# Patient Record
Sex: Female | Born: 1960 | Hispanic: No | Marital: Married | Smoking: Never smoker
Health system: Southern US, Community
[De-identification: ages and names within clinical notes are randomized; demographics above are authoritative.]

## PROBLEM LIST (undated history)

## (undated) DIAGNOSIS — Z789 Other specified health status: Secondary | ICD-10-CM

---

## 2002-07-02 ENCOUNTER — Emergency Department (HOSPITAL_COMMUNITY): Admission: EM | Admit: 2002-07-02 | Discharge: 2002-07-02 | Payer: Self-pay | Admitting: Emergency Medicine

## 2002-07-02 ENCOUNTER — Encounter: Payer: Self-pay | Admitting: Emergency Medicine

## 2004-09-25 ENCOUNTER — Encounter: Admission: RE | Admit: 2004-09-25 | Discharge: 2004-09-25 | Payer: Self-pay | Admitting: Cardiovascular Disease

## 2004-10-23 ENCOUNTER — Encounter: Admission: RE | Admit: 2004-10-23 | Discharge: 2004-10-23 | Payer: Self-pay | Admitting: Cardiovascular Disease

## 2004-11-01 ENCOUNTER — Encounter: Admission: RE | Admit: 2004-11-01 | Discharge: 2004-11-01 | Payer: Self-pay | Admitting: Cardiovascular Disease

## 2005-12-05 ENCOUNTER — Encounter: Admission: RE | Admit: 2005-12-05 | Discharge: 2005-12-05 | Payer: Self-pay | Admitting: Cardiovascular Disease

## 2006-10-28 HISTORY — PX: ABDOMINAL HYSTERECTOMY: SHX81

## 2007-05-25 ENCOUNTER — Other Ambulatory Visit: Admission: RE | Admit: 2007-05-25 | Discharge: 2007-05-25 | Payer: Self-pay | Admitting: Family Medicine

## 2007-08-06 ENCOUNTER — Encounter (INDEPENDENT_AMBULATORY_CARE_PROVIDER_SITE_OTHER): Payer: Self-pay | Admitting: Obstetrics and Gynecology

## 2007-08-06 ENCOUNTER — Inpatient Hospital Stay (HOSPITAL_COMMUNITY): Admission: RE | Admit: 2007-08-06 | Discharge: 2007-08-08 | Payer: Self-pay | Admitting: Obstetrics and Gynecology

## 2008-10-12 IMAGING — US US PELVIS COMPLETE
1 series · 13 of 25 positions shown · non-contrast
Comparison: NONE

CLINICAL DATA: Enlarged uterus. 

PELVIC ULTRASOUND

[Series 1: us pelvic · 0.32mm/px · 13 of 51 slices shown]
[im 1/51]
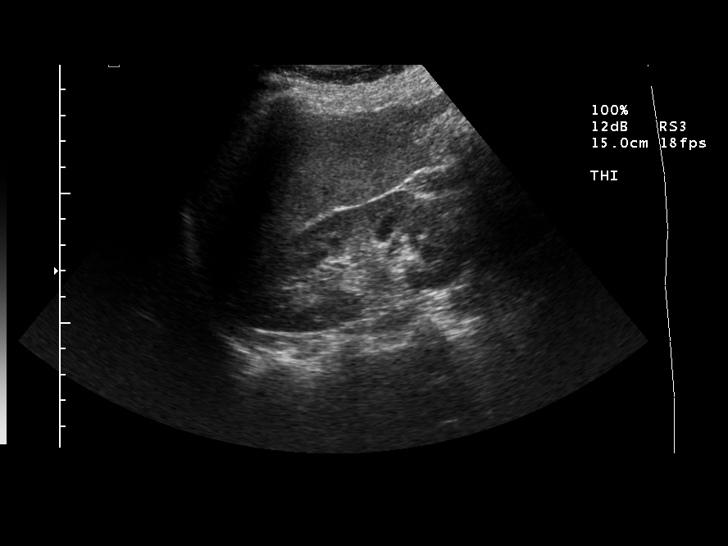
[im 5/51]
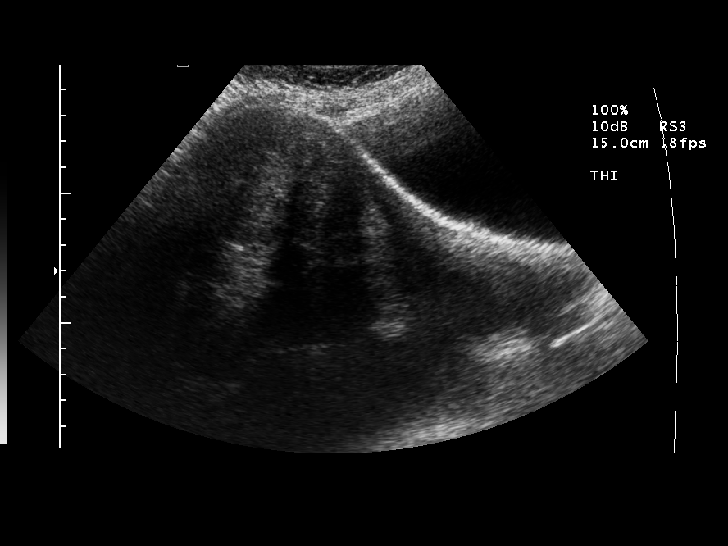
[im 9/51]
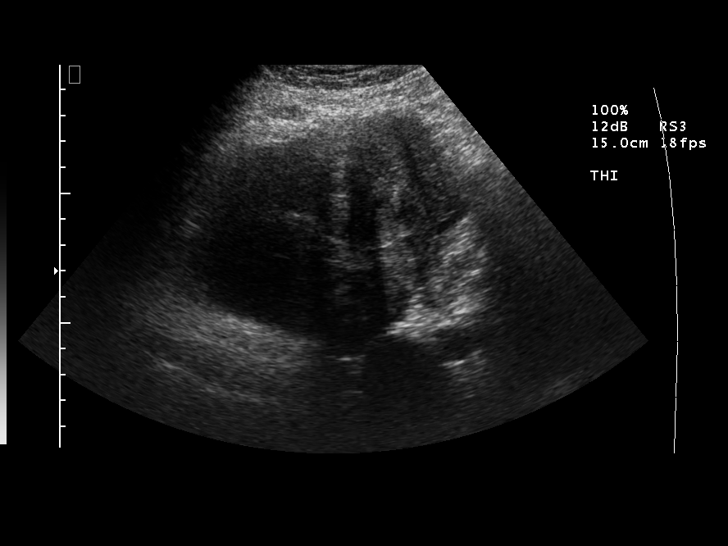
[im 13/51]
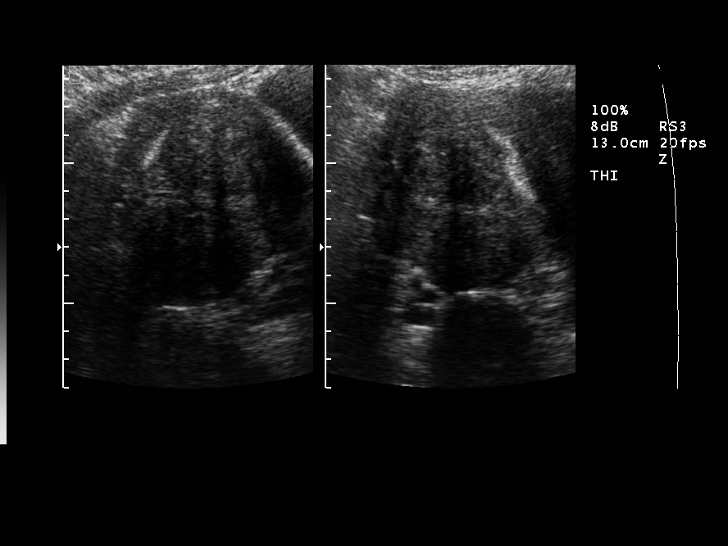
[im 17/51]
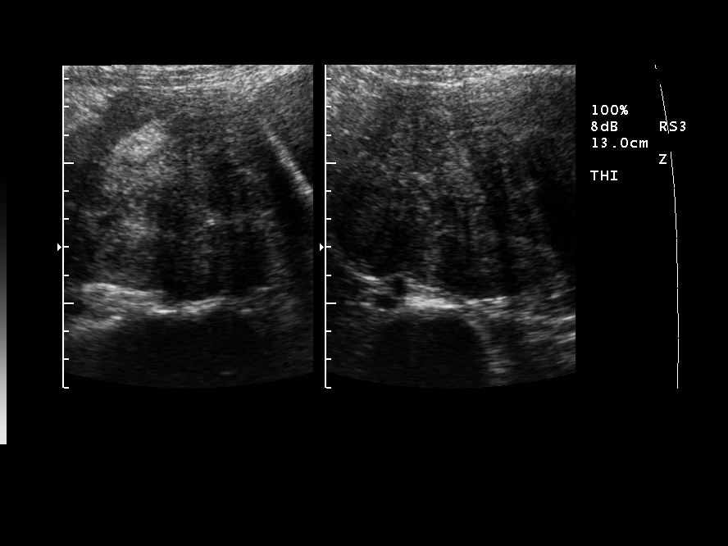
[im 21/51]
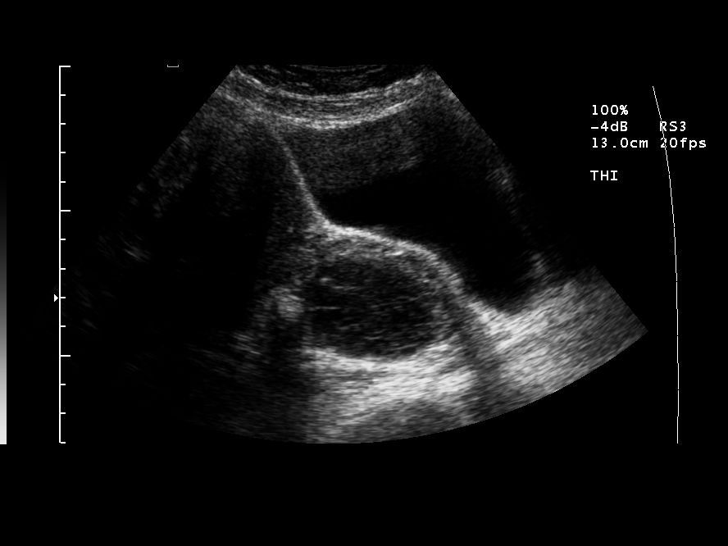
[im 26/51]
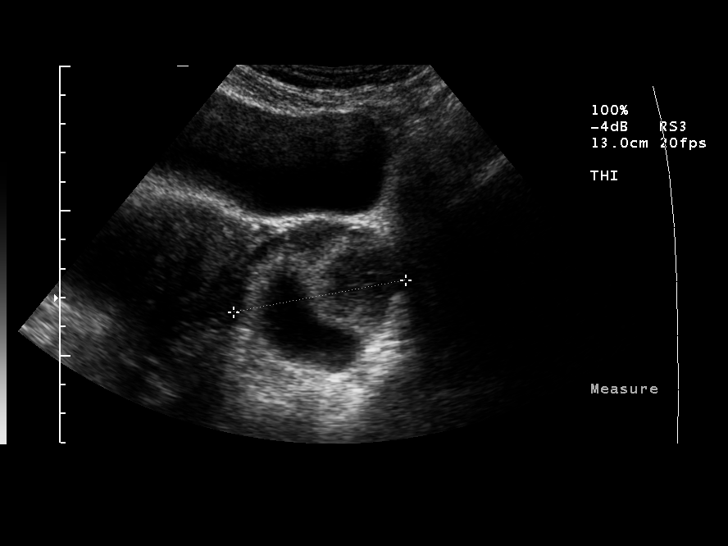
[im 30/51]
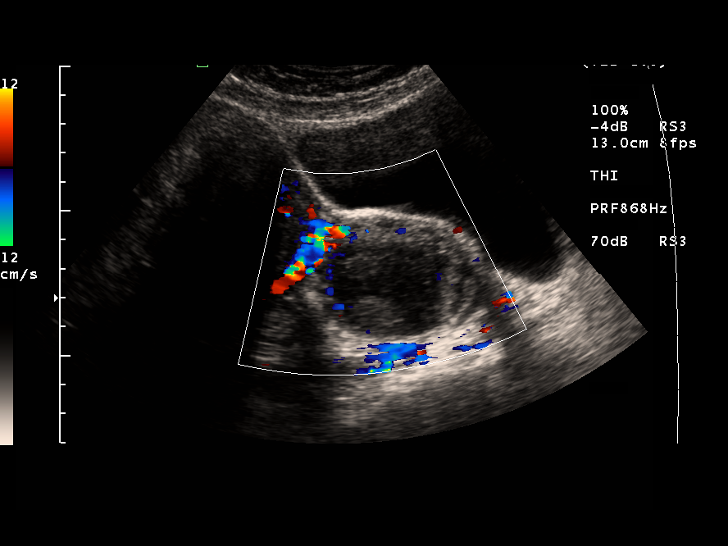
[im 34/51]
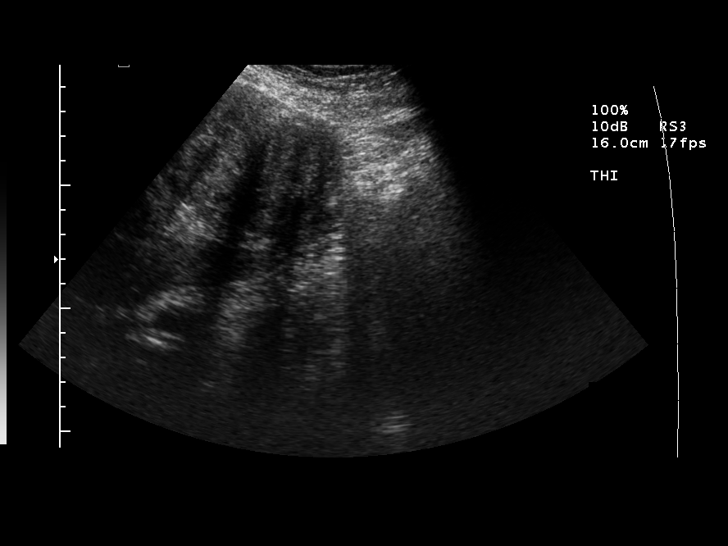
[im 38/51]
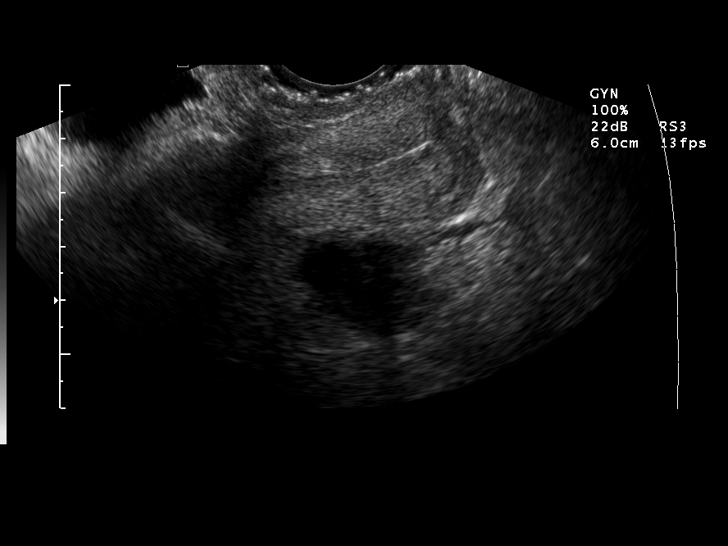
[im 42/51]
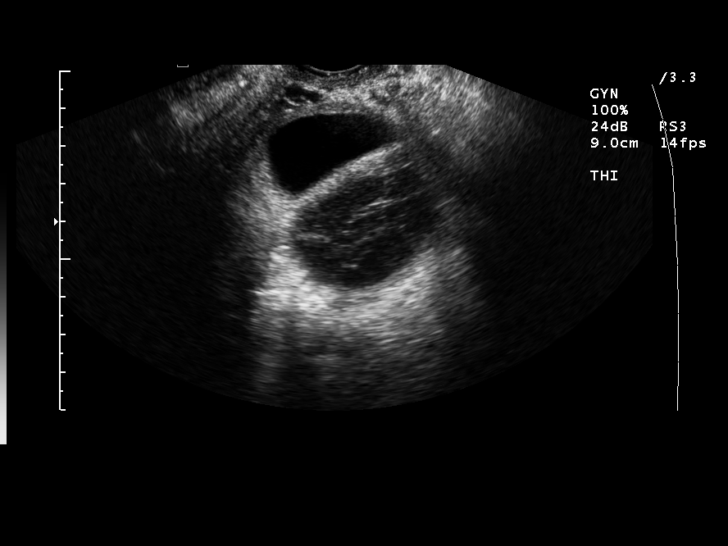
[im 46/51]
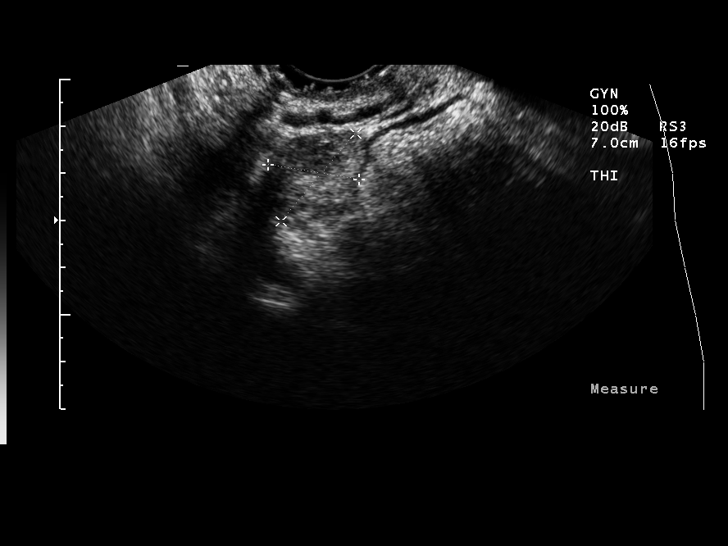
[im 51/51]
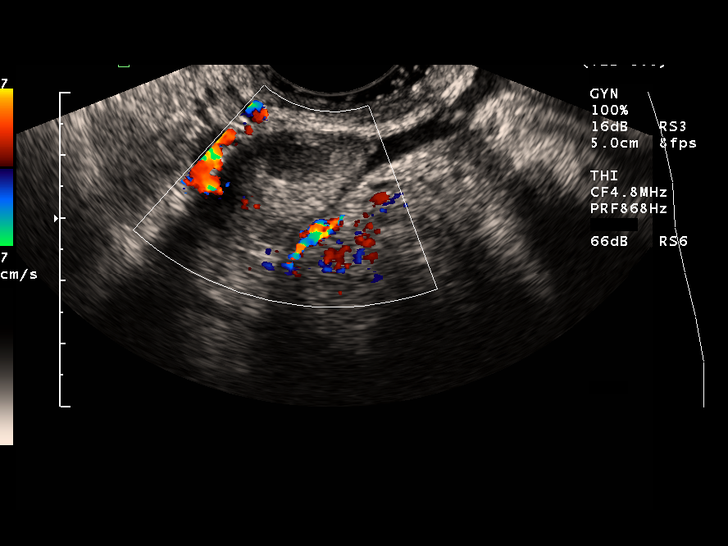

[13 of 25 positions shown; findings below may reference images not displayed]

FINDINGS: Transabdominal and endovaginal ultrasound was 
performed. The uterus measures 16 x 11.6 x 10.2 cm with an 
estimated volume of 987 mL. This is markedly enlarged. The 
endometrial stripe measures 5 mm and is within normal limits. 
There is an isoechoic myometrial mass seen which exerts a mass 
effect on the endometrial stripe. This measures 4 x 3.7 x 3.2 cm. 
There is a second myometrial mass seen. This appears to be 
slightly hypoechoic and measures 3.6 x 4.2 x 3.7 cm. There is a 
third hypoechoic myometrial mass seen that measures 4.1 x 4 x
cm. There is an isoechoic myometrial mass seen that measures 5.6 x 
5.9 x 5.4 cm. No abnormal fluid is seen in the uterine cavity. 
RIGHT OVARY: Measures 2.9 x 2 x 2.4 cm. With an estimated volume 
of 7 mL. This is within normal limits. There is a hypoechoic area 
seen in the right ovary that measures 10 x 7 x 10 mm. LEFT OVARY: 
Measures 6.2 x 6 x 4.2 cm with an estimated volume of 82 mL. This 
is markedly enlarged. There is a hypoechoic area seen that 
measures 2.7 x 5 x 3.4 cm. There is a simple-appearing cyst seen 
that measures 3 x 4.2 x 3.7 cm. Suggestion of a small amount of 
free fluid in the cul-de-sac.
IMPRESSION: Complex cyst or cystic neoplasm in the left ovary. I 
would recommend a follow-up ultrasound after 2 months or 2 
menstrual cycles to re-evaluate this finding. Left ovary is 
markedly enlarged. The uterus is markedly enlarged. There are 
multiple leiomyomata seen. Apartmentsgasparini Homen, M.D. Electronically 
06/12/2007 [facility]  [REDACTED]

## 2011-03-12 NOTE — Discharge Summary (Signed)
[name], [name]                  ACCOUNT NO.:  [id number]   MEDICAL RECORD NO.:  [id number]          PATIENT TYPE:  INP   LOCATION:  9318                          FACILITY:  [facility]   PHYSICIAN:  Charles A. Delcambre, MDDATE OF BIRTH:  11-08-1960   DATE OF ADMISSION:  08/06/2007  DATE OF DISCHARGE:  08/08/2007                               DISCHARGE SUMMARY   PRIMARY DISCHARGE DIAGNOSES:  1. Menorrhagia.  2. Uterine leiomyomata.  3. Mild endometriosis.  4. Ovarian cysts, benign.   PROCEDURE:  Transabdominal hysterectomy, bilateral salpingo-  oophorectomy.   PATHOLOGY:  Uterus, tubes and ovaries to pathology, pathology pending.   POSTOPERATIVE LABORATORY:  Hemoglobin 8.8, hematocrit 25.7, platelets  232,000, white blood cell count 7300.  Originally, as I recall, had a  SGPT of 50 and repeat on admission was 38. Fasting sugar was 100 and  upon admission sugar was 105 on CBG as I recall.  Uterus, tubes and  ovaries were sent to pathology.   History and physical as dictated on the chart.   HOSPITAL COURSE:  The patient was admitted and underwent surgery as  noted above.  Postoperatively she had a routine postoperative course. On  postoperative day one  PCA was discontinued. She was changed to Percocet and did well.  She  voided without difficulty when the catheter was discontinued.  Showered  and dressing was removed that evening.  She had spontaneous return of  flatus on postoperative day one.  On postoperative day two she continues  to do well, has no nausea or vomiting.  Her pain is controlled.  Diet as  tolerated.  She is voiding without difficulty.  Exam is benign and  therefore she is discharged home.   FOLLOW UP:  In the office on August 11, 2007 for staple removal (that  would be at 72 hours).   MEDICATIONS:  A prescription for Percocet 5/325 #40 1-2 p.o. q.4 hours  p.r.n., Motrin 800 mg one p.o. q.8h p.r.n. #30 refill x2., and Tandem  once daily #30 refill x1  (she  may use an iron supplement that she already has instead of Tandem  if so would like).   She is instructed to call for temperature greater than 100 degrees,  increased pain or significant vaginal bleeding, incisional erythema or  drainage. Instructions for no driving x2 weeks.  Shower okay x2 weeks,  bath thereafter.  No lifting greater than 20-30 pounds x4 weeks and no  vaginal penetration for sexual activity x4-6 weeks until she has a  postoperative check for complete healing evaluation.      Charles A. Sydnee Cabal, MD  Electronically Signed     CAD/MEDQ  D:  08/08/2007  T:  08/08/2007  Job:  [id number]

## 2011-03-12 NOTE — Op Note (Signed)
NAMEJAMISEN, Theresa Stevenson                  ACCOUNT NO.:  [id number]   MEDICAL RECORD NO.:  [id number]          PATIENT TYPE:  AMB   LOCATION:  SDC                           [facility]   PHYSICIAN:  Charles A. Delcambre, MDDATE OF BIRTH:  03-06-1961   DATE OF PROCEDURE:  08/06/2007  DATE OF DISCHARGE:                               OPERATIVE REPORT   PREOPERATIVE DIAGNOSES:  1. Menorrhagia.  2. Uterine leiomyomata.  3. Ovarian cysts, one appearing complex, on the left.   POSTOPERATIVE DIAGNOSES:  1. Menorrhagia.  2. Uterine leiomyomata.  3. Ovarian cysts, appeared simple.  4. Mild endometriosis was noted.   PROCEDURE:  Transabdominal hysterectomy and bilateral salpingo-  oophorectomy.   SURGEON:  Charles A. Sydnee Cabal, M.D.   ASSISTANT:  Gerald Leitz, M.D.   COMPLICATIONS:  None.   ESTIMATED BLOOD LOSS:  200 mL.   ANESTHESIA:  General by the endotracheal route.   FINDINGS:  Multiple uterine leiomyomata, bilateral simple-appearing  ovarian cysts, several small, less than 5-mm superficial endometriosis  implants in the posterior cul-de-sac and one on the sigmoid colon and  several on the anterior uterus and peritoneum.   SPECIMEN:  Uterus, tubes and ovaries to pathology.   COMPLICATIONS:  None.   COUNTS:  Instrument, sponge, and needle counts correct x2.   DESCRIPTION OF PROCEDURE:  The patient was taken to the operating room  and placed in the supine position.  General anesthetic was induced  without difficulty.  She was sterilely prepped and draped in the usual  fashion.   A vertical skin incision was made up to the umbilicus and carried about  half-way around the umbilicus to make room for the uterus which was  approximately 18 weeks size and with the possibility of an ovarian mass.  The fascia was incised with a knife and Mayo scissors.  The rectus  muscles were bluntly dissected in the midline.  The peritoneum was  entered with Metzenbaum scissors without damage to  bowel, bladder, or  vascular structures.  The peritoneum was taken superiorly and  inferiorly, and the bladder was skeletonized.  Traction was used.  A  Balfour retractor was placed.  Moistened laps x5 were used to pack the  bowel out of the pelvis.  Kelly clamps were placed on the uterine  cornual regions.  The round ligaments were transected bilaterally with  the Bovie and transfixion stitched with 0 Vicryl and held.  The  vesicouterine peritoneum was taken down across the lower uterine segment  in preparation for development of the bladder flap.  The  infundibulopelvic pedicle was isolated.  The ureter was seen well clear  of the pedicle.  The pedicle was taken, free tied, and transfixion  stitched with 0 Vicryl with good hemostasis resulting on the left, and  the same procedure was done on the right with the ureter seen well  clear.  After the round ligament was taken, the infundibulopelvic  pedicle was isolated, taken with the ureter under direct visualization  well clear and free tied and then transfixion stitched with 0 Vicryl.  The bladder was  taken down with Metzenbaum scissors and blunt  dissection.  The uterine vessels were skeletonized on either side.  Simple stitches were placed after pedicles were taken.  Two pedicles  were taken down either side and taken with the remainder of the cardinal  ligaments, and the cervix was then amputated.  These were transfixion  stitched with 0 Vicryl cervix.  The cervix was then amputated from the  upper uterus for better visualization.  There was no damage to bowel  noted.  Kocher clamps were placed on the cervical stump.  Two curved  Zeppelin clamps were placed, getting the uterosacral ligaments on either  side.  Next, the pedicles on either side entered the vagina.  These were  all transfixion stitched, and the cervix was amputated from the vagina  with the Mayo scissors.  Richardson angle sutures with 0 Vicryl were  placed on the vaginal  angles and tied and then tied to the uterosacral  ligaments on either side.  The cuff was then closed with 0 Vicryl  running locking suture.  Hemostasis appeared adequate after minor  electrocautery on the peritoneal edge, and one area was tied with figure-  of-eight suture of 2-0 Vicryl, with good hemostasis resulting.  There  was just the very slightest ooze on the right sidewall near the ureter,  and for this reason, Avitene was placed.  Irrigation had been carried  out x3, and as this was very minor oozing, that Avitene is expected to  take care of it.  The bowel was replaced over the pelvis after all  instruments and sutures were cut.  The pedicles had been visualized.  The bladder flap and cuff had been visualized to be of good hemostasis.  The moistened laps were removed x5.  Lap count was correct.  The Balfour  retractor was removed.  #1 PDS running nonlocking suture was used close  the uterine incision en bloc.  Subcutaneous irrigation was carried out.  Hemostasis was excellent after minor electrocautery.  Skin staples were  placed.  Sterile skin pressure dressing was then applied.   The patient was taken to recovery with physician in attendance, having  tolerated her procedure well.      Charles A. Sydnee Cabal, MD  Electronically Signed     CAD/MEDQ  D:  08/06/2007  T:  08/06/2007  Job:  [id number]   cc:   Carma Leaven, DO  Fax: [phone]

## 2011-03-15 NOTE — H&P (Signed)
Theresa Stevenson, BELDING                  ACCOUNT NO.:  [id number]   MEDICAL RECORD NO.:  [id number]          PATIENT TYPE:  AMB   LOCATION:  SDC                           [facility]   PHYSICIAN:  Charles A. Delcambre, MDDATE OF BIRTH:  1961/09/01   DATE OF ADMISSION:  08/03/2007  DATE OF DISCHARGE:                              HISTORY & PHYSICAL   This patient originally referred from Dr. Vedia Coffer with [facility]  for menorrhagia and an enlarged uterus.  She has noted complex left  adnexal cyst at 6 cm.  Multiple myometrial masses were noted with  fibroids.  She is a 50 year old gravida 3, para 1-0-2-1.  She has left  lower quadrant pain as well.   PAST MEDICAL:  None.   SURGICAL:  SVD x1.   MEDICATIONS:  None.   ALLERGIES:  None.   SOCIAL HISTORY:  No tobacco, ethanol, or drug use or STD exposure.  She  is married in a monogamous relationship with her husband.   FAMILY HISTORY:  Hypertension in her father, otherwise no major  illnesses.   REVIEW OF SYSTEMS:  Premenstrual migraines, occasional breast discharge  with her period, unrelated numbness right at the tips of digits 2 and 3  of her left hand that has been constant and long-term.  No fever,  chills, rashes, lesions, headaches, dizziness, seasonal allergies, chest  pain, shortness of breath, wheezing, diarrhea, constipation, bleeding  known, hematochezia, urgency, frequency, dysuria, incontinence,  hematuria, galactorrhea, no emotional changes.   PHYSICAL EXAM:  Alert and oriented x3, no distress.  Weight 123, blood pressure 118/80, pulse 68, respirations 20.  HEENT:  Grossly within normal limits.  LUNGS:  Clear bilaterally.  ABDOMEN:  Soft, flat, nontender.  HEART:  Regular rate and rhythm without murmurs, rubs, or gallops.  BREASTS:  Symmetrical and otherwise deferred.  PELVIC:  Normal external female genitalia.  Bartholin, urethral, Skene's  within normal limits.  Vault without discharge or lesions,  multiparous  cervix, uterus enlarged, adnexal mass palpable consistent with fibroids  and possibly complex cysts on the left in addition to the 3 cm simple  cyst on the left.  Exam is nontender.   ASSESSMENT:  Menorrhagia, uterine fibroids, pelvic pain, and ovarian  cyst.   PLAN:  1. Transabdominal hysterectomy.  2. Bilateral salpingo-oophorectomy.  3. She gives informed consent and accepts the risks of infection,      abdominal and bladder damage, ureteral damage, blood clot risk,      including hepatitis and human immunodeficiency virus exposure, deep      venous thrombosis are all accepted.  She understands the nature of      the procedure and indications.  Alternatives have been discussed,      and we will proceed as outlined.  4. Preoperative CBC, urine pregnancy test, BMET, type and screen, CBG      upon arrival, NPO past midnight.  Surgery scheduled for October 6      at 11:30.      Charles A. Sydnee Cabal, MD  Electronically Signed     CAD/MEDQ  D:  07/16/2007  T:  07/16/2007  Job:  [id number]

## 2011-08-08 LAB — COMPREHENSIVE METABOLIC PANEL
ALT: 38 — ABNORMAL HIGH
AST: 35
Albumin: 3.9
Alkaline Phosphatase: 78
BUN: 6
CO2: 25
Calcium: 9.4
Chloride: 106
Creatinine, Ser: 0.62
GFR calc Af Amer: >60
GFR calc non Af Amer: >60
Glucose, Bld: 100 — ABNORMAL HIGH
Potassium: 3.9
Sodium: 136
Total Bilirubin: 0.7
Total Protein: 7.3

## 2011-08-08 LAB — CBC
HCT: 25.7 — ABNORMAL LOW
HCT: 33.7 — ABNORMAL LOW
Hemoglobin: 11.4 — ABNORMAL LOW
Hemoglobin: 8.8 — ABNORMAL LOW
MCHC: 33.7
MCHC: 34.1
MCV: 82.5
MCV: 82.7
Platelets: 232
Platelets: 308
RBC: 3.12 — ABNORMAL LOW
RBC: 4.07
RDW: 15.1 — ABNORMAL HIGH
RDW: 15.5 — ABNORMAL HIGH
WBC: 6.4
WBC: 7.3

## 2011-08-08 LAB — TYPE AND SCREEN
ABO/RH(D): B POS
Antibody Screen: NEGATIVE

## 2011-08-08 LAB — PREGNANCY, URINE: Preg Test, Ur: NEGATIVE

## 2011-08-08 LAB — ABO/RH: ABO/RH(D): B POS

## 2012-06-11 ENCOUNTER — Other Ambulatory Visit: Payer: Self-pay | Admitting: Radiology

## 2012-07-03 ENCOUNTER — Ambulatory Visit (INDEPENDENT_AMBULATORY_CARE_PROVIDER_SITE_OTHER): Payer: BC Managed Care – PPO | Admitting: Surgery

## 2012-07-10 ENCOUNTER — Encounter (INDEPENDENT_AMBULATORY_CARE_PROVIDER_SITE_OTHER): Payer: Self-pay | Admitting: General Surgery

## 2012-07-10 ENCOUNTER — Ambulatory Visit (INDEPENDENT_AMBULATORY_CARE_PROVIDER_SITE_OTHER): Payer: BC Managed Care – PPO | Admitting: General Surgery

## 2012-07-10 VITALS — BP 136/78 | HR 70 | Resp 16 | Ht 62.0 in | Wt 130.0 lb

## 2012-07-10 DIAGNOSIS — N6489 Other specified disorders of breast: Secondary | ICD-10-CM | POA: Insufficient documentation

## 2012-07-10 DIAGNOSIS — L905 Scar conditions and fibrosis of skin: Secondary | ICD-10-CM

## 2012-07-10 NOTE — Progress Notes (Signed)
Patient ID: Theresa Stevenson, female   DOB: 07/14/1961, 51 y.o.   MRN: [id number]  Chief Complaint  Patient presents with  . 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 female.   HPI 51-year-old Indian female referred by Dr. Cornella for evaluation for right breast biopsy. The patient had underwent her annual screening mammogram in April of this year and received a normal mammogram. She read present in early August complaining of bloody nipple discharge on the right. A diagnostic unilateral right breast mammogram as well as a right breast ultrasound was performed. This demonstrated ductal ectasia in the 8:30 to 9:00 position approximately 2 cm from the nipple. Increased echogenicity is noted within the duct. This was present over a segment approximately 1.9 cm. On Doppler evaluation, focal perfusion was noted within this region. It was thought it may be related to papilloma. It was read as a BI-RADS 4. She underwent core needle biopsy of the area of concern on August 15. A marking clip was placed into the biopsy cavity. A right core biopsy found a sclerosing lesion. She was sent here for consideration of surgical biopsy.  Age of menarche was 15. She is a G3 P1. Age of first pregnancy 51 years of age. She underwent a hysterectomy. No hormonal replacement therapy. She denies any family history of breast, ovarian, prostate, or colon cancer. She states that she had an episode of unilateral right breast discharge many years ago. She denies any previous breast biopsies until this year. She denies any current discharge or skin changes or breast pain. She denies any breast masses  History reviewed. No pertinent past medical history.  Past Surgical History  Procedure Date  . Abdominal hysterectomy 2008    History reviewed. No pertinent family history.  Social History History  Substance Use Topics  . Smoking status: Never Smoker   . Smokeless tobacco: Not on file  . Alcohol  Use: No    Not on File  Current Outpatient Prescriptions  Medication Sig Dispense Refill  . Multiple Vitamin (MULTIVITAMIN) tablet Take 1 tablet by mouth daily.        Review of Systems Review of Systems  Constitutional: Negative for fever, fatigue and unexpected weight change.  HENT: Negative for hearing loss and nosebleeds.   Respiratory: Negative for chest tightness and shortness of breath.   Cardiovascular: Negative for chest pain and palpitations.       Denies sob, DOE, orthopnea, PND  Gastrointestinal: Negative for abdominal pain.  Genitourinary: Negative for difficulty urinating and menstrual problem.  Neurological: Negative for tremors, seizures, speech difficulty and light-headedness.  Hematological: Negative for adenopathy. Does not bruise/bleed easily.  Psychiatric/Behavioral: Negative for behavioral problems and disturbed wake/sleep cycle.  All other systems reviewed and are negative.    Blood pressure 136/78, pulse 70, resp. rate 16, height 5' 2" (1.575 m), weight 130 lb (58.968 kg).  Physical Exam Physical Exam  Vitals reviewed. Constitutional: She is oriented to person, place, and time. She appears well-developed and well-nourished. No distress.  HENT:  Head: Normocephalic and atraumatic.  Right Ear: External ear normal.  Left Ear: External ear normal.  Eyes: Conjunctivae normal are normal. No scleral icterus.  Neck: Normal range of motion. Neck supple. No tracheal deviation present. No thyromegaly present.  Cardiovascular: Normal rate, regular rhythm and normal heart sounds.   Pulmonary/Chest: Effort normal and breath sounds normal. No respiratory distress. She has no wheezes. She exhibits no   tenderness. Right breast exhibits no inverted nipple, no mass, no nipple discharge, no skin change and no tenderness. Left breast exhibits no inverted nipple, no mass, no nipple discharge, no skin change and no tenderness.       Left breast is more full than Rt; left  breast has more ptosis than R; no palpable masses  Abdominal: She exhibits no distension.  Musculoskeletal: She exhibits no edema and no tenderness.  Lymphadenopathy:    She has no cervical adenopathy.    She has no axillary adenopathy.       Right: No supraclavicular adenopathy present.       Left: No supraclavicular adenopathy present.  Neurological: She is alert and oriented to person, place, and time. She exhibits normal muscle tone.  Skin: Skin is warm and dry. No rash noted. She is not diaphoretic. No erythema.  Psychiatric: She has a normal mood and affect. Her behavior is normal. Judgment and thought content normal.    Data Reviewed Mammogram report from 02/20/12 Mammogram report films from 05/2012 Core biopsy path report  Assessment    Right breast sclerosing lesion/radial scar    Plan    We discussed her mammogram and ultrasound findings. We also discussed her core biopsy results. We discussed sclerosing lesions as well as radial scars. We discussed the possibility of that area having potentially precancerous cells. We discussed observation versus wire guided breast biopsy. We discussed the pros and cons of each. We discussed the risk and benefits of surgery including but not limited to bleeding, infection, injury to surrounding structures, seroma formation, hematoma formation, cosmetic concerns, blood clot formation, anesthesia risks, as well as the typical postoperative recovery course. We also talked about the rare possibility of having to excise additional breast tissue based on the path report.  The patient has elected to proceed to the operating room for a wire guided right breast biopsy. We will coordinate this with [facility]  Beula Joyner M. Glorya Bartley, MD, FACS General, Bariatric, & Minimally Invasive Surgery [facility]        Eliyas Suddreth M 07/10/2012, 4:36 PM    

## 2012-07-10 NOTE — Patient Instructions (Signed)
We will call you to schedule your surgery

## 2012-07-15 ENCOUNTER — Encounter (INDEPENDENT_AMBULATORY_CARE_PROVIDER_SITE_OTHER): Payer: Self-pay

## 2012-08-03 ENCOUNTER — Encounter (HOSPITAL_BASED_OUTPATIENT_CLINIC_OR_DEPARTMENT_OTHER)
Admission: RE | Admit: 2012-08-03 | Discharge: 2012-08-03 | Disposition: A | Discharge status: Home / Self Care | Payer: BC Managed Care – PPO | Source: Ambulatory Visit | Attending: General Surgery | Admitting: General Surgery

## 2012-08-03 ENCOUNTER — Encounter (HOSPITAL_BASED_OUTPATIENT_CLINIC_OR_DEPARTMENT_OTHER): Payer: Self-pay | Admitting: *Deleted

## 2012-08-03 LAB — CBC WITH DIFFERENTIAL/PLATELET
Eosinophils Relative: 2 % (ref 0–5)
HCT: 35.3 % — ABNORMAL LOW (ref 36.0–46.0)
Hemoglobin: 11.9 g/dL — ABNORMAL LOW (ref 12.0–15.0)
Lymphocytes Relative: 30 % (ref 12–46)
Lymphs Abs: 1.8 10*3/uL (ref 0.7–4.0)
MCV: 80.4 fL (ref 78.0–100.0)
Monocytes Absolute: 0.3 10*3/uL (ref 0.1–1.0)
RBC: 4.39 MIL/uL (ref 3.87–5.11)
WBC: 6.2 10*3/uL (ref 4.0–10.5)

## 2012-08-04 ENCOUNTER — Encounter (HOSPITAL_BASED_OUTPATIENT_CLINIC_OR_DEPARTMENT_OTHER): Payer: Self-pay | Admitting: Anesthesiology

## 2012-08-04 ENCOUNTER — Ambulatory Visit (HOSPITAL_BASED_OUTPATIENT_CLINIC_OR_DEPARTMENT_OTHER): Payer: BC Managed Care – PPO | Admitting: Anesthesiology

## 2012-08-04 ENCOUNTER — Telehealth (INDEPENDENT_AMBULATORY_CARE_PROVIDER_SITE_OTHER): Payer: Self-pay | Admitting: General Surgery

## 2012-08-04 ENCOUNTER — Telehealth (INDEPENDENT_AMBULATORY_CARE_PROVIDER_SITE_OTHER): Payer: Self-pay

## 2012-08-04 ENCOUNTER — Encounter (HOSPITAL_BASED_OUTPATIENT_CLINIC_OR_DEPARTMENT_OTHER)
Admission: RE | Disposition: A | Discharge status: Home / Self Care | Payer: Self-pay | Source: Ambulatory Visit | Attending: General Surgery

## 2012-08-04 ENCOUNTER — Ambulatory Visit (HOSPITAL_BASED_OUTPATIENT_CLINIC_OR_DEPARTMENT_OTHER)
Admission: RE | Admit: 2012-08-04 | Discharge: 2012-08-04 | Disposition: A | Discharge status: Home / Self Care | Payer: BC Managed Care – PPO | Source: Ambulatory Visit | Attending: General Surgery | Admitting: General Surgery

## 2012-08-04 ENCOUNTER — Encounter (HOSPITAL_BASED_OUTPATIENT_CLINIC_OR_DEPARTMENT_OTHER): Payer: Self-pay | Admitting: *Deleted

## 2012-08-04 DIAGNOSIS — Z01812 Encounter for preprocedural laboratory examination: Secondary | ICD-10-CM | POA: Insufficient documentation

## 2012-08-04 DIAGNOSIS — N6489 Other specified disorders of breast: Secondary | ICD-10-CM

## 2012-08-04 DIAGNOSIS — N6089 Other benign mammary dysplasias of unspecified breast: Secondary | ICD-10-CM

## 2012-08-04 DIAGNOSIS — D249 Benign neoplasm of unspecified breast: Secondary | ICD-10-CM

## 2012-08-04 HISTORY — PX: BREAST BIOPSY: SHX20

## 2012-08-04 HISTORY — DX: Other specified health status: Z78.9

## 2012-08-04 SURGERY — BREAST BIOPSY WITH NEEDLE LOCALIZATION
Anesthesia: General | Site: Breast | Laterality: Right | Wound class: Clean

## 2012-08-04 MED ORDER — LIDOCAINE HCL (CARDIAC) 20 MG/ML IV SOLN
INTRAVENOUS | Status: DC | PRN
  Administered 2012-08-04: 75 mg via INTRAVENOUS

## 2012-08-04 MED ORDER — BUPIVACAINE-EPINEPHRINE 0.25% -1:200000 IJ SOLN
INTRAMUSCULAR | Status: DC | PRN
  Administered 2012-08-04: 10 mL

## 2012-08-04 MED ORDER — ACETAMINOPHEN 10 MG/ML IV SOLN
1000.0000 mg | Freq: Once | INTRAVENOUS | Status: AC
  Administered 2012-08-04: 1000 mg via INTRAVENOUS

## 2012-08-04 MED ORDER — OXYCODONE HCL 5 MG PO TABS: 5.0000 mg | ORAL_TABLET | Freq: Once | ORAL | Status: DC | PRN

## 2012-08-04 MED ORDER — CEFAZOLIN SODIUM-DEXTROSE 2-3 GM-% IV SOLR
2.0000 g | INTRAVENOUS | Status: AC
  Administered 2012-08-04: 2 g via INTRAVENOUS

## 2012-08-04 MED ORDER — ONDANSETRON HCL 4 MG/2ML IJ SOLN
INTRAMUSCULAR | Status: DC | PRN
  Administered 2012-08-04: 4 mg via INTRAVENOUS

## 2012-08-04 MED ORDER — FENTANYL CITRATE 0.05 MG/ML IJ SOLN
INTRAMUSCULAR | Status: DC | PRN
  Administered 2012-08-04 (×2): 50 ug via INTRAVENOUS

## 2012-08-04 MED ORDER — OXYCODONE-ACETAMINOPHEN 5-325 MG PO TABS: 1.0000 | ORAL_TABLET | ORAL | Status: DC | PRN

## 2012-08-04 MED ORDER — MIDAZOLAM HCL 5 MG/5ML IJ SOLN
INTRAMUSCULAR | Status: DC | PRN
  Administered 2012-08-04: 2 mg via INTRAVENOUS

## 2012-08-04 MED ORDER — OXYCODONE HCL 5 MG/5ML PO SOLN
5.0000 mg | Freq: Once | ORAL | Status: DC | PRN
Start: 2012-08-04 — End: 2012-08-04

## 2012-08-04 MED ORDER — LACTATED RINGERS IV SOLN
INTRAVENOUS | Status: DC
  Administered 2012-08-04: 11:00:00 via INTRAVENOUS

## 2012-08-04 MED ORDER — DEXAMETHASONE SODIUM PHOSPHATE 4 MG/ML IJ SOLN
INTRAMUSCULAR | Status: DC | PRN
  Administered 2012-08-04: 10 mg via INTRAVENOUS

## 2012-08-04 MED ORDER — HYDROMORPHONE HCL PF 1 MG/ML IJ SOLN: 0.2500 mg | INTRAMUSCULAR | Status: DC | PRN

## 2012-08-04 MED ORDER — PHENYLEPHRINE HCL 10 MG/ML IJ SOLN
INTRAMUSCULAR | Status: DC | PRN
  Administered 2012-08-04: 80 ug via INTRAVENOUS

## 2012-08-04 MED ORDER — PROPOFOL 10 MG/ML IV BOLUS
INTRAVENOUS | Status: DC | PRN
  Administered 2012-08-04: 150 mg via INTRAVENOUS

## 2012-08-04 SURGICAL SUPPLY — 49 items
APL SKNCLS STERI-STRIP NONHPOA (GAUZE/BANDAGES/DRESSINGS) ×1
APPLIER CLIP 9.375 MED OPEN (MISCELLANEOUS)
APR CLP MED 9.3 20 MLT OPN (MISCELLANEOUS)
BANDAGE ELASTIC 6 VELCRO ST LF (GAUZE/BANDAGES/DRESSINGS) ×1 IMPLANT
BENZOIN TINCTURE PRP APPL 2/3 (GAUZE/BANDAGES/DRESSINGS) ×1 IMPLANT
BLADE SURG 15 STRL LF DISP TIS (BLADE) ×1 IMPLANT
BLADE SURG 15 STRL SS (BLADE) ×2
CANISTER SUCTION 1200CC (MISCELLANEOUS) IMPLANT
CHLORAPREP W/TINT 26ML (MISCELLANEOUS) ×2 IMPLANT
CLIP APPLIE 9.375 MED OPEN (MISCELLANEOUS) IMPLANT
COVER MAYO STAND STRL (DRAPES) ×2 IMPLANT
COVER TABLE BACK 60X90 (DRAPES) ×2 IMPLANT
DECANTER SPIKE VIAL GLASS SM (MISCELLANEOUS) IMPLANT
DEVICE DUBIN W/COMP PLATE 8390 (MISCELLANEOUS) ×1 IMPLANT
DRAPE PED LAPAROTOMY (DRAPES) ×2 IMPLANT
DRAPE UTILITY XL STRL (DRAPES) ×2 IMPLANT
DRSG TEGADERM 2-3/8X2-3/4 SM (GAUZE/BANDAGES/DRESSINGS) ×1 IMPLANT
DRSG TEGADERM 4X4.75 (GAUZE/BANDAGES/DRESSINGS) ×2 IMPLANT
ELECT COATED BLADE 2.86 ST (ELECTRODE) ×2 IMPLANT
ELECT REM PT RETURN 9FT ADLT (ELECTROSURGICAL) ×2
ELECTRODE REM PT RTRN 9FT ADLT (ELECTROSURGICAL) ×1 IMPLANT
GAUZE SPONGE 4X4 12PLY STRL LF (GAUZE/BANDAGES/DRESSINGS) ×1 IMPLANT
GLOVE BIO SURGEON STRL SZ7 (GLOVE) ×1 IMPLANT
GLOVE BIO SURGEON STRL SZ7.5 (GLOVE) ×2 IMPLANT
GLOVE BIOGEL PI IND STRL 8 (GLOVE) ×1 IMPLANT
GLOVE BIOGEL PI INDICATOR 8 (GLOVE) ×1
GOWN PREVENTION PLUS XLARGE (GOWN DISPOSABLE) ×2 IMPLANT
GOWN PREVENTION PLUS XXLARGE (GOWN DISPOSABLE) ×1 IMPLANT
NDL HYPO 25X1 1.5 SAFETY (NEEDLE) ×1 IMPLANT
NEEDLE HYPO 25X1 1.5 SAFETY (NEEDLE) ×2 IMPLANT
NS IRRIG 1000ML POUR BTL (IV SOLUTION) ×2 IMPLANT
PACK BASIN DAY SURGERY FS (CUSTOM PROCEDURE TRAY) ×2 IMPLANT
PENCIL BUTTON HOLSTER BLD 10FT (ELECTRODE) ×2 IMPLANT
SLEEVE SCD COMPRESS KNEE MED (MISCELLANEOUS) ×2 IMPLANT
SPONGE GAUZE 2X2 8PLY STRL LF (GAUZE/BANDAGES/DRESSINGS) ×1 IMPLANT
SPONGE LAP 18X18 X RAY DECT (DISPOSABLE) ×1 IMPLANT
SPONGE LAP 4X18 X RAY DECT (DISPOSABLE) ×2 IMPLANT
STRIP CLOSURE SKIN 1/2X4 (GAUZE/BANDAGES/DRESSINGS) ×1 IMPLANT
SUT MNCRL AB 4-0 PS2 18 (SUTURE) ×1 IMPLANT
SUT SILK 2 0 SH (SUTURE) ×1 IMPLANT
SUT VIC AB 3-0 SH 27 (SUTURE)
SUT VIC AB 3-0 SH 27X BRD (SUTURE) IMPLANT
SUT VICRYL 3-0 CR8 SH (SUTURE) ×1 IMPLANT
SYR CONTROL 10ML LL (SYRINGE) ×2 IMPLANT
TOWEL OR 17X24 6PK STRL BLUE (TOWEL DISPOSABLE) ×3 IMPLANT
TOWEL OR NON WOVEN STRL DISP B (DISPOSABLE) ×2 IMPLANT
TUBE CONNECTING 20X1/4 (TUBING) IMPLANT
WATER STERILE IRR 1000ML POUR (IV SOLUTION) IMPLANT
YANKAUER SUCT BULB TIP NO VENT (SUCTIONS) IMPLANT

## 2012-08-04 NOTE — Telephone Encounter (Signed)
Called and left message for patient to return call to obtain post op appointment information. Post op set up for 08/21/12 at 8:30.

## 2012-08-04 NOTE — Transfer of Care (Signed)
Immediate Anesthesia Transfer of Care Note  Patient: Theresa Stevenson  Procedure(s) Performed: Procedure(s) (LRB) with comments: BREAST BIOPSY WITH NEEDLE LOCALIZATION (Right)  Patient Location: PACU  Anesthesia Type: General  Level of Consciousness: sedated  Airway & Oxygen Therapy: Patient Spontanous Breathing and Patient connected to face mask oxygen  Post-op Assessment: Report given to PACU RN and Post -op Vital signs reviewed and stable  Post vital signs: Reviewed and stable  Complications: No apparent anesthesia complications

## 2012-08-04 NOTE — Interval H&P Note (Signed)
History and Physical Interval Note:  08/04/2012 12:33 PM  Theresa Stevenson  has presented today for surgery, with the diagnosis of Rt breast radial scar  The various methods of treatment have been discussed with the patient and family. After consideration of risks, benefits and other options for treatment, the patient has consented to  Procedure(s) (LRB) with comments: BREAST BIOPSY WITH NEEDLE LOCALIZATION (Right) as a surgical intervention .  The patient's history has been reviewed, patient examined, no change in status, stable for surgery.  I have reviewed the patient's chart and labs.  Questions were answered to the patient's satisfaction.   Mary Sella. Andrey Campanile, MD, FACS General, Bariatric, & Minimally Invasive Surgery [facility] [place]   [facility] M

## 2012-08-04 NOTE — Anesthesia Preprocedure Evaluation (Signed)
Anesthesia Evaluation  Patient identified by MRN, date of birth, ID band Patient awake    Reviewed: Allergy & Precautions, H&P , NPO status , Patient's Chart, lab work & pertinent test results  Airway Mallampati: II TM Distance: >3 FB Neck ROM: Full    Dental No notable dental hx. (+) Teeth Intact and Dental Advisory Given   Pulmonary neg pulmonary ROS,  breath sounds clear to auscultation  Pulmonary exam normal       Cardiovascular negative cardio ROS  Rhythm:Regular Rate:Normal     Neuro/Psych negative neurological ROS  negative psych ROS   GI/Hepatic negative GI ROS, Neg liver ROS,   Endo/Other  negative endocrine ROS  Renal/GU negative Renal ROS  negative genitourinary   Musculoskeletal   Abdominal   Peds  Hematology negative hematology ROS (+)   Anesthesia Other Findings   Reproductive/Obstetrics negative OB ROS                           Anesthesia Physical Anesthesia Plan  ASA: I  Anesthesia Plan: General   Post-op Pain Management:    Induction: Intravenous  Airway Management Planned: LMA  Additional Equipment:   Intra-op Plan:   Post-operative Plan: Extubation in OR  Informed Consent: I have reviewed the patients History and Physical, chart, labs and discussed the procedure including the risks, benefits and alternatives for the proposed anesthesia with the patient or authorized representative who has indicated his/her understanding and acceptance.   Dental advisory given  Plan Discussed with: CRNA  Anesthesia Plan Comments:         Anesthesia Quick Evaluation  

## 2012-08-04 NOTE — H&P (View-Only) (Signed)
Patient ID: Theresa Stevenson, female   DOB: [birth year], 51 y.o.   MRN: [id number]  Chief Complaint  Patient presents with  . 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 female.   HPI 51 year old [place] female referred by Dr. Tilda Burrow for evaluation for right breast biopsy. The patient had underwent her annual screening mammogram in April of this year and received a normal mammogram. She read present in early August complaining of bloody nipple discharge on the right. A diagnostic unilateral right breast mammogram as well as a right breast ultrasound was performed. This demonstrated ductal ectasia in the 8:30 to 9:00 position approximately 2 cm from the nipple. Increased echogenicity is noted within the duct. This was present over a segment approximately 1.9 cm. On Doppler evaluation, focal perfusion was noted within this region. It was thought it may be related to papilloma. It was read as a BI-RADS 4. She underwent core needle biopsy of the area of concern on August 15. A marking clip was placed into the biopsy cavity. A right core biopsy found a sclerosing lesion. She was sent here for consideration of surgical biopsy.  Age of menarche was 89. She is a G3 P1. Age of first pregnancy 51 years of age. She underwent a hysterectomy. No hormonal replacement therapy. She denies any family history of breast, ovarian, prostate, or colon cancer. She states that she had an episode of unilateral right breast discharge many years ago. She denies any previous breast biopsies until this year. She denies any current discharge or skin changes or breast pain. She denies any breast masses  History reviewed. No pertinent past medical history.  Past Surgical History  Procedure Date  . Abdominal hysterectomy 2008    History reviewed. No pertinent family history.  Social History History  Substance Use Topics  . Smoking status: Never Smoker   . Smokeless tobacco: Not on file  . Alcohol  Use: No    Not on File  Current Outpatient Prescriptions  Medication Sig Dispense Refill  . Multiple Vitamin (MULTIVITAMIN) tablet Take 1 tablet by mouth daily.        Review of Systems Review of Systems  Constitutional: Negative for fever, fatigue and unexpected weight change.  HENT: Negative for hearing loss and nosebleeds.   Respiratory: Negative for chest tightness and shortness of breath.   Cardiovascular: Negative for chest pain and palpitations.       Denies sob, DOE, orthopnea, PND  Gastrointestinal: Negative for abdominal pain.  Genitourinary: Negative for difficulty urinating and menstrual problem.  Neurological: Negative for tremors, seizures, speech difficulty and light-headedness.  Hematological: Negative for adenopathy. Does not bruise/bleed easily.  Psychiatric/Behavioral: Negative for behavioral problems and disturbed wake/sleep cycle.  All other systems reviewed and are negative.    Blood pressure 136/78, pulse 70, resp. rate 16, height 5\' 2"  (1.575 m), weight 130 lb (58.968 kg).  Physical Exam Physical Exam  Vitals reviewed. Constitutional: She is oriented to person, place, and time. She appears well-developed and well-nourished. No distress.  HENT:  Head: Normocephalic and atraumatic.  Right Ear: External ear normal.  Left Ear: External ear normal.  Eyes: Conjunctivae normal are normal. No scleral icterus.  Neck: Normal range of motion. Neck supple. No tracheal deviation present. No thyromegaly present.  Cardiovascular: Normal rate, regular rhythm and normal heart sounds.   Pulmonary/Chest: Effort normal and breath sounds normal. No respiratory distress. She has no wheezes. She exhibits no  tenderness. Right breast exhibits no inverted nipple, no mass, no nipple discharge, no skin change and no tenderness. Left breast exhibits no inverted nipple, no mass, no nipple discharge, no skin change and no tenderness.       Left breast is more full than Rt; left  breast has more ptosis than R; no palpable masses  Abdominal: She exhibits no distension.  Musculoskeletal: She exhibits no edema and no tenderness.  Lymphadenopathy:    She has no cervical adenopathy.    She has no axillary adenopathy.       Right: No supraclavicular adenopathy present.       Left: No supraclavicular adenopathy present.  Neurological: She is alert and oriented to person, place, and time. She exhibits normal muscle tone.  Skin: Skin is warm and dry. No rash noted. She is not diaphoretic. No erythema.  Psychiatric: She has a normal mood and affect. Her behavior is normal. Judgment and thought content normal.    Data Reviewed Mammogram report from 02/20/12 Mammogram report films from 05/2012 Core biopsy path report  Assessment    Right breast sclerosing lesion/radial scar    Plan    We discussed her mammogram and ultrasound findings. We also discussed her core biopsy results. We discussed sclerosing lesions as well as radial scars. We discussed the possibility of that area having potentially precancerous cells. We discussed observation versus wire guided breast biopsy. We discussed the pros and cons of each. We discussed the risk and benefits of surgery including but not limited to bleeding, infection, injury to surrounding structures, seroma formation, hematoma formation, cosmetic concerns, blood clot formation, anesthesia risks, as well as the typical postoperative recovery course. We also talked about the rare possibility of having to excise additional breast tissue based on the path report.  The patient has elected to proceed to the operating room for a wire guided right breast biopsy. We will coordinate this with Alphonzo Lemmings. Andrey Campanile, MD, FACS General, Bariatric, & Minimally Invasive Surgery [facility], [place]        [facility] M 07/10/2012, 4:36 PM

## 2012-08-04 NOTE — Brief Op Note (Signed)
08/04/2012  1:41 PM  PATIENT:  Theresa Stevenson  51 y.o. female  PRE-OPERATIVE DIAGNOSIS:  Right breast radial scar  POST-OPERATIVE DIAGNOSIS:  Right breast radial scar  PROCEDURE:  Procedure(s) (LRB) with comments: BREAST BIOPSY WITH NEEDLE LOCALIZATION (Right)  SURGEON:  Surgeon(s) and Role:    * Atilano Ina, MD,FACS - Primary  PHYSICIAN ASSISTANT:   ASSISTANTS: none   ANESTHESIA:   general and with LMA  EBL:     BLOOD ADMINISTERED:none  DRAINS: none   LOCAL MEDICATIONS USED:  MARCAINE     SPECIMEN:  Source of Specimen:  right breast biopsy, single superior, double deep, lateral wire  DISPOSITION OF SPECIMEN:  PATHOLOGY  COUNTS:  YES  TOURNIQUET:  * No tourniquets in log *  DICTATION: .Other Dictation: Dictation Number [id number]  PLAN OF CARE: Discharge to home after PACU  PATIENT DISPOSITION:  PACU - hemodynamically stable.   Delay start of Pharmacological VTE agent (>24hrs) due to surgical blood loss or risk of bleeding: not applicable  Mary Sella. Andrey Campanile, MD, FACS General, Bariatric, & Minimally Invasive Surgery [facility], [place]

## 2012-08-04 NOTE — Telephone Encounter (Signed)
Pt would like her RTW papers filled out prior to returning to work on 07/18/12.  Dr. Andrey Campanile is out of the office until 10/23.  Please advise.

## 2012-08-04 NOTE — Telephone Encounter (Signed)
Dr Andrey Campanile in the office on 08/13/2012. He can sign paperwork if it comes to him by then.

## 2012-08-04 NOTE — Anesthesia Procedure Notes (Signed)
Procedure Name: LMA Insertion Date/Time: 08/04/2012 12:46 PM Performed by: Zenia Resides D Pre-anesthesia Checklist: Patient identified, Emergency Drugs available, Suction available, Patient being monitored and Timeout performed Patient Re-evaluated:Patient Re-evaluated prior to inductionOxygen Delivery Method: Circle System Utilized Preoxygenation: Pre-oxygenation with 100% oxygen Intubation Type: IV induction Ventilation: Mask ventilation without difficulty LMA: LMA inserted LMA Size: 4.0 Grade View: Grade I Number of attempts: 1 Airway Equipment and Method: bite block Placement Confirmation: positive ETCO2 and breath sounds checked- equal and bilateral Tube secured with: Tape Dental Injury: Teeth and Oropharynx as per pre-operative assessment

## 2012-08-04 NOTE — Anesthesia Postprocedure Evaluation (Signed)
Anesthesia Post Note  Patient: Theresa Stevenson  Procedure(s) Performed: Procedure(s) (LRB): BREAST BIOPSY WITH NEEDLE LOCALIZATION (Right)  Anesthesia type: General  Patient location: PACU  Post pain: Pain level controlled and Adequate analgesia  Post assessment: Post-op Vital signs reviewed, Patient's Cardiovascular Status Stable, Respiratory Function Stable, Patent Airway and Pain level controlled  Last Vitals:  Filed Vitals:   08/04/12 1445  BP:   Pulse: 74  Temp:   Resp: 13    Post vital signs: Reviewed and stable  Level of consciousness: awake, alert  and oriented  Complications: No apparent anesthesia complications

## 2012-08-05 ENCOUNTER — Encounter (HOSPITAL_BASED_OUTPATIENT_CLINIC_OR_DEPARTMENT_OTHER): Payer: Self-pay | Admitting: General Surgery

## 2012-08-05 ENCOUNTER — Encounter (INDEPENDENT_AMBULATORY_CARE_PROVIDER_SITE_OTHER): Payer: Self-pay

## 2012-08-05 NOTE — Op Note (Signed)
Theresa Stevenson, Theresa Stevenson                  ACCOUNT NO.:  [id number]  MEDICAL RECORD NO.:  [id number]  LOCATION:                               FACILITY:  [facility]  PHYSICIAN:  Mary Sella. Andrey Campanile, MD, FACSDATE OF BIRTH:  1961/06/29  DATE OF PROCEDURE:  08/04/2012 DATE OF DISCHARGE:  08/04/2012                              OPERATIVE REPORT   PREOPERATIVE DIAGNOSIS:  Right breast sclerosing lesion-radial scar.  POSTOPERATIVE DIAGNOSIS:  Right breast sclerosing lesion-radial scar.  PROCEDURE:  Right breast needle localized biopsy.  SURGEON:  Mary Sella. Andrey Campanile, MD, FACS.  ANESTHESIA:  General with LMA plus local consisting of 0.25% Marcaine with epinephrine.  ESTIMATED BLOOD LOSS:  Minimal.  SPECIMEN:  Right breast biopsy, single stitch marks superior margin, double stitch marks deep margin, wire marks lateral margin.  DRAINS:  None.  COMPLICATIONS:  None immediately apparent.  INDICATIONS FOR PROCEDURE:  The patient is a 51 year old [place] female who underwent her annual mammogram screening early the year, but she presented in early August complaining of bloody nipple discharge on the right.  Diagnostic right breast mammogram and ultrasound was performed. There was ductal ectasia in the 8:30 to 9 o'clock region.  She underwent a core biopsy which demonstrated a sclerosing lesion.  We discussed observation versus surgical biopsy.  We discussed the risks and benefits of surgery including, but not limited to, bleeding, infection, injury to surrounding structures, blood clot formation, seroma formation, hematoma formation, need for additional procedures, anesthesia risks as well as typical postoperative course.  She elected to proceed to the operating room.  DESCRIPTION OF PROCEDURE:  The patient went to [facility] and had a guidewire placed into the lesion of concern.  It was placed near the marking clip from prior biopsy.  She was then brought to the [facility].  I  confirmed the right breast as operative site with my initials.  She was then taken to the operating room 3 and placed supine on the operating room table.  General LMA anesthesia was established.  Her right breast and chest were prepped and draped in usual standard surgical fashion.  I reviewed the mammograms that were obtained this morning.  A surgical time-out was performed.  She received antibiotics prior to skin incision.  Sequential compression devices had been placed.  The wire entered her breast at about the 7:30 position in the lower outer quadrant about 6 cm from the nipple-areolar complex.  I made an curvilinear incision about 1 cm proximal on the inside of the guidewire where it entered the skin for about 4 cm after infiltrating the skin with local.  The deep dermis was divided with electrocautery. I then tunneled and delivered the guidewire into the incision.  An Allis clamp was placed onto the breast tissue into which the guidewire dotted. I then circumferentially cored out the surrounding breast tissue around the guidewire in a circumferential fashion.  It was freed.  It was placed in the specimen mammograph container and an x-ray was obtained which demonstrated that the clip was well within the specimen.  It should be noted that we did not visualize the wire while mobilizing the breast  tissue.  The specimen was marked as described above.  The cavity was irrigated.  Hemostasis was achieved.  Local was infiltrated.  I reapproximated some of the deep breast tissue with 2 interrupted inverted 3-0 Vicryl sutures.  The deep dermis was closed in an interrupted fashion with inverted interrupted 3-0 Vicryl and the skin was reapproximated with 4-0 Monocryl in a subcuticular fashion followed by the application of benzoin, Steri-Strips, 2 x 2 and a Tegaderm.  All needle, instrument, and sponge counts were correct x2.  The patient tolerated the procedure well.  There were no immediate  complications. The patient tolerated the procedure well.     Mary Sella. Andrey Campanile, MD, FACS     EMW/MEDQ  D:  08/04/2012  T:  08/04/2012  Job:  [id number]  cc:   Rogelia Mire, MD [facility]

## 2012-08-11 ENCOUNTER — Telehealth (INDEPENDENT_AMBULATORY_CARE_PROVIDER_SITE_OTHER): Payer: Self-pay | Admitting: General Surgery

## 2012-08-11 NOTE — Telephone Encounter (Signed)
Patient called back. Made her aware pathology is benign.

## 2012-08-11 NOTE — Telephone Encounter (Signed)
Called patient to make her aware pathology is benign. Someone picked up the phone and hung up. Will try again later.

## 2012-08-21 ENCOUNTER — Encounter (INDEPENDENT_AMBULATORY_CARE_PROVIDER_SITE_OTHER): Payer: Self-pay | Admitting: General Surgery

## 2012-08-21 ENCOUNTER — Ambulatory Visit (INDEPENDENT_AMBULATORY_CARE_PROVIDER_SITE_OTHER): Payer: BC Managed Care – PPO | Admitting: General Surgery

## 2012-08-21 VITALS — BP 92/60 | HR 68 | Temp 98.6°F | Resp 16 | Ht 62.0 in | Wt 130.0 lb

## 2012-08-21 DIAGNOSIS — Z09 Encounter for follow-up examination after completed treatment for conditions other than malignant neoplasm: Secondary | ICD-10-CM

## 2012-08-21 NOTE — Patient Instructions (Signed)
Can resume full activities 

## 2012-08-21 NOTE — Progress Notes (Signed)
Subjective:     Patient ID: Theresa Stevenson, female   DOB: [birth year], 51 y.o.   MRN: [id number]  HPI 51 year old female comes in for followup after undergoing a needle localized right breast biopsy on October 8. She states that she has been doing well. She denies any fevers or chills. She denies any nausea or vomiting. She denies any trouble with her incision. She still has some soreness in her right breast but it has gotten better.  Review of Systems     Objective:   Physical Exam BP 92/60  Pulse 68  Temp 98.6 F (37 C) (Temporal)  Resp 16  Ht 5\' 2"  (1.575 m)  Wt 130 lb (58.968 kg)  BMI 23.78 kg/m2 Alert, nad Right breast - outer lower quadrant incision - healing well. No cellulitis. No hematoma. Mild bruising at superior aspect    Assessment:     S/p right breast needle localized biopsy    Plan:     We discussed her pathology report. 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 There is no atypia or malignancy. She appears to be doing well. I advised her that the soreness will continue to improve over time. I have reminded her she will need ongoing yearly mammograms. Followup as needed  Mary Sella. Andrey Campanile, MD, FACS General, Bariatric, & Minimally Invasive Surgery [facility], [place]

## 2012-12-12 ENCOUNTER — Other Ambulatory Visit: Payer: Self-pay

## 2013-09-02 ENCOUNTER — Other Ambulatory Visit: Payer: Self-pay

## 2015-06-27 ENCOUNTER — Ambulatory Visit (INDEPENDENT_AMBULATORY_CARE_PROVIDER_SITE_OTHER): Payer: BLUE CROSS/BLUE SHIELD | Admitting: Sports Medicine

## 2015-06-27 ENCOUNTER — Ambulatory Visit (INDEPENDENT_AMBULATORY_CARE_PROVIDER_SITE_OTHER): Payer: BLUE CROSS/BLUE SHIELD

## 2015-06-27 ENCOUNTER — Encounter: Payer: Self-pay | Admitting: Sports Medicine

## 2015-06-27 VITALS — BP 127/82 | HR 58 | Ht 63.0 in | Wt 126.0 lb

## 2015-06-27 DIAGNOSIS — M5412 Radiculopathy, cervical region: Secondary | ICD-10-CM | POA: Diagnosis not present

## 2015-06-27 DIAGNOSIS — M47892 Other spondylosis, cervical region: Secondary | ICD-10-CM | POA: Diagnosis not present

## 2015-06-27 MED ORDER — PREDNISONE 50 MG PO TABS: ORAL_TABLET | ORAL | Status: DC

## 2015-06-27 MED ORDER — CYCLOBENZAPRINE HCL 10 MG PO TABS: ORAL_TABLET | ORAL | Status: DC

## 2015-06-27 NOTE — Progress Notes (Signed)
   Subjective:    I'm seeing this patient as a consultation for:  Dr. Drema Dallas  CC: Back and right arm pain  HPI: This is a pleasant 54 year old female, she works at [employer], for the past several weeks she's had increasing pain in her right periscapular region with radiation down the right arm, to the right forearm and the thumb. Symptoms are moderate, persistent, worse with flexion, she is having great difficulty sleeping. No bowel or bladder dysfunction, saddle numbness, constitutional symptoms, lower extremity symptoms.  Past medical history, Surgical history, Family history not pertinant except as noted below, Social history, Allergies, and medications have been entered into the medical record, reviewed, and no changes needed.   Review of Systems: No headache, visual changes, nausea, vomiting, diarrhea, constipation, dizziness, abdominal pain, skin rash, fevers, chills, night sweats, weight loss, swollen lymph nodes, body aches, joint swelling, muscle aches, chest pain, shortness of breath, mood changes, visual or auditory hallucinations.   Objective:   General: Well Developed, well nourished, and in no acute distress.  Neuro/Psych: Alert and oriented x3, extra-ocular muscles intact, able to move all 4 extremities, sensation grossly intact. Skin: Warm and dry, no rashes noted.  Respiratory: Not using accessory muscles, speaking in full sentences, trachea midline.  Cardiovascular: Pulses palpable, no extremity edema. Abdomen: Does not appear distended. Neck: Negative spurling's Full neck range of motion Grip strength and sensation normal in bilateral hands Strength good C4 to T1 distribution No sensory change to C4 to T1 Reflexes somewhat hypoactive on the right side.  Impression and Recommendations:   This case required medical decision making of moderate complexity.

## 2015-06-27 NOTE — Assessment & Plan Note (Signed)
With right-sided C5 and C6 periscapular radiculopathy, we are going to do 5 days of prednisone, Flexeril at bedtime, formal physical therapy, x-rays. She does have x-rays from 2003 that do show C5-6 and C4-5 degenerative changes. Return to see me in one month, MRI for interventional no better. 5 pound lifting restriction at [employer].

## 2015-06-29 ENCOUNTER — Ambulatory Visit (INDEPENDENT_AMBULATORY_CARE_PROVIDER_SITE_OTHER): Payer: BLUE CROSS/BLUE SHIELD | Admitting: Sports Medicine

## 2015-06-29 ENCOUNTER — Encounter: Payer: Self-pay | Admitting: Sports Medicine

## 2015-06-29 VITALS — BP 140/81 | HR 79 | Ht 63.0 in | Wt 128.0 lb

## 2015-06-29 DIAGNOSIS — M5412 Radiculopathy, cervical region: Secondary | ICD-10-CM

## 2015-06-29 NOTE — Assessment & Plan Note (Signed)
Complete resolution of axial pain, 30-40% improvement in radicular pain.  Paperwork filled out today, keep one month follow-up appointment.

## 2015-06-29 NOTE — Progress Notes (Signed)
  Subjective:    CC: Follow-up  HPI: Right C5 and C6 periscapular as well as right upper arm cervical radiculopathy: After 2 days on prednisone she is 100% resolved with regards to her axial pain, in approximately 30-40% improvement with regards to her radicular pain. She does have some paperwork that she would like filled out.  Past medical history, Surgical history, Family history not pertinant except as noted below, Social history, Allergies, and medications have been entered into the medical record, reviewed, and no changes needed.   Review of Systems: No fevers, chills, night sweats, weight loss, chest pain, or shortness of breath.   Objective:    General: Well Developed, well nourished, and in no acute distress.  Neuro: Alert and oriented x3, extra-ocular muscles intact, sensation grossly intact.  HEENT: Normocephalic, atraumatic, pupils equal round reactive to light, neck supple, no masses, no lymphadenopathy, thyroid nonpalpable.  Skin: Warm and dry, no rashes. Cardiac: Regular rate and rhythm, no murmurs rubs or gallops, no lower extremity edema.  Respiratory: Clear to auscultation bilaterally. Not using accessory muscles, speaking in full sentences.  Paperwork was filled out.  Impression and Recommendations:    I spent 25 minutes with this patient, greater than 50% was face-to-face time counseling regarding the above diagnoses

## 2015-07-05 ENCOUNTER — Ambulatory Visit (INDEPENDENT_AMBULATORY_CARE_PROVIDER_SITE_OTHER): Payer: BLUE CROSS/BLUE SHIELD | Admitting: Physical Therapy

## 2015-07-05 ENCOUNTER — Encounter: Payer: Self-pay | Admitting: Physical Therapy

## 2015-07-05 DIAGNOSIS — M542 Cervicalgia: Secondary | ICD-10-CM | POA: Diagnosis not present

## 2015-07-05 DIAGNOSIS — M79601 Pain in right arm: Secondary | ICD-10-CM | POA: Diagnosis not present

## 2015-07-05 DIAGNOSIS — R29898 Other symptoms and signs involving the musculoskeletal system: Secondary | ICD-10-CM

## 2015-07-05 DIAGNOSIS — M6289 Other specified disorders of muscle: Secondary | ICD-10-CM

## 2015-07-05 DIAGNOSIS — M539 Dorsopathy, unspecified: Secondary | ICD-10-CM

## 2015-07-05 NOTE — Patient Instructions (Signed)
CHEST: Doorway, Bilateral - Standing   Standing in doorway, place hands on wall with elbows bent at shoulder height. Lean forward. Hold _30-60__ seconds. _2__ reps per set, _2__ sets per day, __5_ days per week  Elevation: Shrug (Distal Resist)   Lift shoulders straight up, then return. Maintain same speed up and down. Avoid moving head and neck forward. Repeat __10__ times per set. Do _2___ sets per session. Do __7__ sessions per week.  The patient is advised to apply ice or cold packs 15-20 min to affected intermittently as needed to relieve pain.  [facility] [place],  [address]  [phone] (office) [phone] (fax)  Copyright  VHI. All rights reserved.

## 2015-07-05 NOTE — Therapy (Signed)
[place], [place], [address] Phone: [phone]   Fax:  [phone]  Physical Therapy Evaluation  Patient Details  Name: Theresa Stevenson MRN: [id number] Date of Birth: October 03, 1961 Referring Provider:  Silverio Decamp,*  Encounter Date: 07/05/2015      PT End of Session - 07/05/15 1147    Visit Number 1   PT Start Time 0160   PT Stop Time 1147   PT Time Calculation (min) 44 min      Past Medical History  Diagnosis Date  . No pertinent past medical history     Past Surgical History  Procedure Laterality Date  . Abdominal hysterectomy  2008  . Breast biopsy  08/04/2012    Procedure: BREAST BIOPSY WITH NEEDLE LOCALIZATION;  Surgeon: Gayland Curry, MD,FACS;  Location: [facility];  Service: General;  Laterality: Right;    There were no vitals filed for this visit.  Visit Diagnosis:  Pain, neck - Plan: PT plan of care cert/re-cert  Pain, arm, right - Plan: PT plan of care cert/re-cert  Weakness of right arm - Plan: PT plan of care cert/re-cert  Weakness of right hand - Plan: PT plan of care cert/re-cert  Tightness of neck - Plan: PT plan of care cert/re-cert      Subjective Assessment - 07/05/15 1103    Subjective Pt reports she began having pain in the mid back that now travels up her neck and into the Rt arm. It began a couple of months ago.  Got really bad a couple of weeks ago.  Was on prednisone and this helped however since she stopped the symptoms have returned.    Pertinent History was on medication, stopped now, tried accupunture and it didnt' help.    How long can you sit comfortably? no limitation except for on the plane over this past weekeknd.     Diagnostic tests x-rays slight degeneratvie changes.    Patient Stated Goals would like to sleep again without pain ( sleeps on the left side.) comb hair without pain, some pain with cleaning   Currently in Pain? Yes    Pain Score 5    Pain Location Neck   Pain Orientation Right   Pain Descriptors / Indicators Sharp;Aching   Pain Type Acute pain   Pain Radiating Towards into Rt forearm and sometimes into the first two finger.    Pain Onset More than a month ago   Pain Frequency Constant   Aggravating Factors  overhead, combing hair and sleeping   Pain Relieving Factors heat sometimes helps            [facility] PT Assessment - 07/05/15 0001    Assessment   Medical Diagnosis Rt cervical radiculopathy   Onset Date/Surgical Date 05/04/15   Hand Dominance Right   Next MD Visit 07/25/15   Prior Therapy none   Precautions   Precautions --  lifting no more than 5#   Balance Screen   Has the patient fallen in the past 6 months No   Has the patient had a decrease in activity level because of a fear of falling?  No   Is the patient reluctant to leave their home because of a fear of falling?  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 on lifting precautions   Leisure play games on phone , watching TV    Observation/Other Assessments   Focus on Therapeutic Outcomes (FOTO)  48% limitation    Posture/Postural Control   Posture/Postural Control Postural limitations   Postural Limitations Forward head;Increased lumbar lordosis;Increased thoracic kyphosis;Rounded Shoulders   ROM / Strength   AROM / PROM / Strength AROM;Strength   AROM   Overall AROM Comments Bilat UE 's WNL   AROM Assessment Site Cervical   Cervical Flexion 3 fingers from chest, with pain   Cervical Extension WNL   Cervical - Right Side Bend WNL  increased symptoms in arm   Cervical - Left Side Bend WNL   Cervical - Left Rotation WNL   Strength   Overall Strength Comments Lt UE WNL   Strength Assessment Site Shoulder;Hand;Elbow   Right/Left  Shoulder Right   Right Shoulder Flexion --  5-/5   Right Shoulder ABduction 5/5   Right Shoulder Internal Rotation 4+/5   Right Shoulder Horizontal ABduction 4+/5   Right/Left Elbow Right  Lt WNL   Right Elbow Flexion --  5-/5   Right Elbow Extension 4+/5   Right/Left hand Right;Left   Right Hand Grip (lbs) 40   Left Hand Grip (lbs) 50   Palpation   Spinal mobility unable to truly assess, pt unable to tolerate prone and very uncomfortable with increased symptoms in Rt UE.    Palpation comment very tight and tender in Rt upper trap and levator.    Special Tests    Special Tests Cervical   Cervical Tests Spurling's   Spurling's   Findings Positive   Side Right                   [facility] Adult PT Treatment/Exercise - 07/05/15 0001    Exercises   Exercises Neck;Shoulder   Neck Exercises: Seated   Neck Retraction --  8 reps    Neck Retraction Limitations Pt given tactile cues for form; pt reported increased radicular symptoms into RUE with this activity.  Stopped.    Shoulder Shrugs 10 reps   Shoulder Shrugs Limitations (added to HEP)   Shoulder Exercises: Stretch   Other Shoulder Stretches Doorway stretch with arms at low and mid height x 30 sec x 2 each position. No increase in symptoms.    Modalities   Modalities Electrical Stimulation;Ultrasound   Acupuncturist Location Rt upper trap/levator    Media planner Stimulation Parameters to tolerance x 8 min    Electrical Stimulation Goals Pain   Ultrasound   Ultrasound Location Rt upper trap/ levator   Ultrasound Parameters 100%, 1.4 w/cm2, 3.3 mHz, 8 min    Ultrasound Goals Pain                PT Education - 07/05/15 1153    Education provided Yes   Education Details HEP: doorway stretch, shoulder shrug.   Pt encouraged to try icing affected area to aid in pain/symptom relief.    Person(s) Educated Patient   Methods  Handout;Explanation;Demonstration   Comprehension Verbalized understanding;Returned demonstration          PT Short Term Goals - 07/05/15 1133    PT SHORT TERM GOAL #1   Title I with initial HEP ( 07/26/15)    Time 3   Period Weeks   Status New   PT SHORT TERM GOAL #2   Title demo  painfree cervical ROM ( 07/26/15)    Time 3   Period Weeks   Status New   PT SHORT TERM GOAL #3   Title improve FOTO =/< 40% limited ( 07/26/15)            PT Long Term Goals - 07/05/15 1134    PT LONG TERM GOAL #1   Title i with advanced HEP ( 08/16/15)   Time 6   Period Weeks   Status New   PT LONG TERM GOAL #2   Title demo Rt UE strength = to Lt UE ( 08/16/15)    Time 6   Period Weeks   Status New   PT LONG TERM GOAL #3   Title increase grip Rt hand =/> 48#  ( 08/16/15)   Time 6   Period Weeks   Status New   PT LONG TERM GOAL #4   Title report overall decrease of symptoms in Rt UE and neck =/> 75% ( 08/16/15 )   Time 6   Period Weeks   Status New   PT LONG TERM GOAL #5   Title improve FOTO=/< 30% limited ( 08/16/15)   Time 6   Period Weeks   Status New               Plan - 07/05/15 1130    Clinical Impression Statement 54 yo female presents with significant symptoms into her Rt UE, she is unable to tolerate lying down prone or supine.  She had some improvement with prednisone however the pain has returned since being off it.  She has positive spurlings and tightness in the Rt upper shoulder/neck.  Patient is currently out of work due to lifting restrictions and wishes to return ASAP.    Pt will benefit from skilled therapeutic intervention in order to improve on the following deficits Postural dysfunction;Decreased strength;Pain;Increased muscle spasms;Impaired UE functional use   Rehab Potential Excellent   PT Frequency 2x / week   PT Duration 6 weeks   PT Treatment/Interventions Moist Heat;Therapeutic exercise;Manual techniques;Taping;Electrical Stimulation;Dry  needling;Cryotherapy;Patient/family education;Passive range of motion;Traction;Ultrasound   PT Next Visit Plan add in traction if pt can tolerate supine position, STW/TPR to neck,   Consulted and Agree with Plan of Care Patient         Problem List Patient Active Problem List   Diagnosis Date Noted  . Right cervical radiculopathy 06/27/2015    Jeral Pinch PT 07/05/2015, 4:10 PM  [facility] [place], [place], [address] Phone: [phone]   Fax:  [phone]

## 2015-07-07 ENCOUNTER — Ambulatory Visit (INDEPENDENT_AMBULATORY_CARE_PROVIDER_SITE_OTHER): Payer: BLUE CROSS/BLUE SHIELD | Admitting: Physical Therapy

## 2015-07-07 DIAGNOSIS — M539 Dorsopathy, unspecified: Secondary | ICD-10-CM

## 2015-07-07 DIAGNOSIS — M542 Cervicalgia: Secondary | ICD-10-CM | POA: Diagnosis not present

## 2015-07-07 DIAGNOSIS — R29898 Other symptoms and signs involving the musculoskeletal system: Secondary | ICD-10-CM

## 2015-07-07 DIAGNOSIS — M6289 Other specified disorders of muscle: Secondary | ICD-10-CM

## 2015-07-07 DIAGNOSIS — M79601 Pain in right arm: Secondary | ICD-10-CM

## 2015-07-07 NOTE — Patient Instructions (Signed)
Scapular Retraction (Standing)   With arms at sides, pinch shoulder blades together. Repeat _10___ times per set. Do __1-2__ sets per session. Do __1-2__ sessions per day.   Axial Extension (Chin Tuck)   Perform this when laying on back, head supported by pillow.  Pull chin GENTLY in and lengthen back of neck. Hold _3___ seconds while counting out loud. Repeat ___10_ times. Do _1-2___ sessions per day.  [email or web address] [email or web address], [place] [address]  [phone] (office) [phone] (fax)

## 2015-07-07 NOTE — Therapy (Addendum)
[place], [place], [address] Phone: [phone]   Fax:  [phone]  Physical Therapy Treatment  Patient Details  Name: Theresa Stevenson MRN: [id number] Date of Birth: [birth year] Referring Provider:  Silverio Decamp,*  Encounter Date: 07/07/2015      PT End of Session - 07/07/15 1334    Visit Number 2   Number of Visits 12   Date for PT Re-Evaluation 08/16/15   PT Start Time 1331   PT Stop Time 1432   PT Time Calculation (min) 61 min   Activity Tolerance Patient limited by pain      Past Medical History  Diagnosis Date  . No pertinent past medical history     Past Surgical History  Procedure Laterality Date  . Abdominal hysterectomy  2008  . Breast biopsy  08/04/2012    Procedure: BREAST BIOPSY WITH NEEDLE LOCALIZATION;  Surgeon: Gayland Curry, MD,FACS;  Location: [facility];  Service: General;  Laterality: Right;    There were no vitals filed for this visit.  Visit Diagnosis:  Pain, neck  Pain, arm, right  Weakness of right arm  Weakness of right hand  Tightness of neck      Subjective Assessment - 07/07/15 1336    Subjective Pt reports she feels a little better than last visit. Still having difficulty sleeping.  She uses a heating pad to decrease pain and help her ease into sleep at night.  Still not working (out for 1 month).    Currently in Pain? Yes   Pain Score 3    Pain Location Arm   Pain Orientation Right   Pain Descriptors / Indicators Heaviness   Pain Radiating Towards to elbow.    Pain Relieving Factors heat             [facility] PT Assessment - 07/07/15 0001    Assessment   Medical Diagnosis Rt cervical radiculopathy   Onset Date/Surgical Date 05/04/15   Hand Dominance Right   Next MD Visit 07/25/15   Prior Therapy none           [facility] Adult PT Treatment/Exercise - 07/07/15 0001    Neck Exercises: Machines for Strengthening   UBE  (Upper Arm Bike) L1: 1.5 min forward/ 1.5 min backward.    Neck Exercises: Theraband   Horizontal ABduction 10 reps  yellow band, supine    Other Theraband Exercises Hooklying and head supported:  with yellow band- 10 reps bilateral shoulder flexion x 10, bilateral shoulder external rotation x 10    Neck Exercises: Supine   Neck Retraction 10 reps  gentle, with AAROM   Shoulder Exercises: Stretch   Other Shoulder Stretches Doorway stretch with arms at low, mid, and high height x 30 sec x 2 each position. No increase in symptoms.    Modalities   Modalities Electrical Stimulation;Cryotherapy   Cryotherapy   Number Minutes Cryotherapy 15 Minutes   Cryotherapy Location Cervical  and Rt thoracic   Type of Cryotherapy Ice pack   Electrical Stimulation   Electrical Stimulation Location Rt upper trap, cervical paraspinal, rhomboid    Electrical Stimulation Action IFC   Electrical Stimulation Parameters to tolerance    Electrical Stimulation Goals Pain   Manual Therapy   Manual Therapy Joint mobilization;Manual Traction;Soft tissue mobilization   Manual therapy comments Tight paraspinals on C5, 6, 7 musculature, tightness in Rt scalenes compared to Lt.    Joint Mobilization Assessed by supervising PT,  Celyn Holt.: unable to differentiate spinal level due to increased RUE radicular symtoms with spinal mobs.     Soft tissue mobilization deep pressure to Rt scalenes on Rt C3-4- symptoms slightly decreased.    Manual Traction 4 attempts at gentle manual traction increased symptoms into RUE, some guarding                 PT Education - 07/07/15 1441    Education provided Yes   Education Details HEP: gentle nods in supine, scap squeeze in sitting    Person(s) Educated Patient   Methods Handout;Explanation;Demonstration   Comprehension Verbalized understanding;Returned demonstration          PT Short Term Goals - 07/05/15 1133    PT SHORT TERM GOAL #1   Title I with initial HEP (  07/26/15)    Time 3   Period Weeks   Status New   PT SHORT TERM GOAL #2   Title demo painfree cervical ROM ( 07/26/15)    Time 3   Period Weeks   Status New   PT SHORT TERM GOAL #3   Title improve FOTO =/< 40% limited ( 07/26/15)            PT Long Term Goals - 07/05/15 1134    PT LONG TERM GOAL #1   Title i with advanced HEP ( 08/16/15)   Time 6   Period Weeks   Status New   PT LONG TERM GOAL #2   Title demo Rt UE strength = to Lt UE ( 08/16/15)    Time 6   Period Weeks   Status New   PT LONG TERM GOAL #3   Title increase grip Rt hand =/> 48#  ( 08/16/15)   Time 6   Period Weeks   Status New   PT LONG TERM GOAL #4   Title report overall decrease of symptoms in Rt UE and neck =/> 75% ( 08/16/15 )   Time 6   Period Weeks   Status New   PT LONG TERM GOAL #5   Title improve FOTO=/< 30% limited ( 08/16/15)   Time 6   Period Weeks   Status New               Plan - 07/07/15 1423    Clinical Impression Statement Pt presents with less neck pain today, but increased Rt UE radicular symptoms.  Pt better able to tolerate supine position this visit.  Per supervising PT, pt reported increased radicular symptoms with attempts at spinal mobilizations and decrease in symptoms with pressure to Rt scalenes.  No goals met, only second visit.    Pt will benefit from skilled therapeutic intervention in order to improve on the following deficits Postural dysfunction;Decreased strength;Pain;Increased muscle spasms;Impaired UE functional use   Rehab Potential Excellent   PT Frequency 2x / week   PT Duration 6 weeks   PT Treatment/Interventions Moist Heat;Therapeutic exercise;Manual techniques;Taping;Electrical Stimulation;Dry needling;Cryotherapy;Patient/family education;Passive range of motion;Traction;Ultrasound   PT Next Visit Plan Continue postural education/strengthening, STW and modalities to neck.    Consulted and Agree with Plan of Care Patient        Problem  List Patient Active Problem List   Diagnosis Date Noted  . Right cervical radiculopathy 06/27/2015    Kerin Perna, PTA 07/07/2015 4:09 PM  [facility] [place], [place], [address] Phone: [phone]   Fax:  [phone]

## 2015-07-12 ENCOUNTER — Ambulatory Visit (INDEPENDENT_AMBULATORY_CARE_PROVIDER_SITE_OTHER): Payer: BLUE CROSS/BLUE SHIELD | Admitting: Physical Therapy

## 2015-07-12 DIAGNOSIS — M79601 Pain in right arm: Secondary | ICD-10-CM | POA: Diagnosis not present

## 2015-07-12 DIAGNOSIS — M6289 Other specified disorders of muscle: Secondary | ICD-10-CM

## 2015-07-12 DIAGNOSIS — R29898 Other symptoms and signs involving the musculoskeletal system: Secondary | ICD-10-CM

## 2015-07-12 DIAGNOSIS — M539 Dorsopathy, unspecified: Secondary | ICD-10-CM

## 2015-07-12 DIAGNOSIS — M542 Cervicalgia: Secondary | ICD-10-CM | POA: Diagnosis not present

## 2015-07-12 NOTE — Therapy (Signed)
[place], [place], [address] Phone: [phone]   Fax:  [phone]  Physical Therapy Treatment  Patient Details  Name: Theresa Stevenson MRN: [id number] Date of Birth: 1961-05-14 Referring Provider:  Silverio Decamp,*  Encounter Date: 07/12/2015      PT End of Session - 07/12/15 1056    Visit Number 3   Number of Visits 12   Date for PT Re-Evaluation 08/16/15   PT Start Time 0623   PT Stop Time 1155   PT Time Calculation (min) 63 min   Activity Tolerance Patient tolerated treatment well      Past Medical History  Diagnosis Date  . No pertinent past medical history     Past Surgical History  Procedure Laterality Date  . Abdominal hysterectomy  2008  . Breast biopsy  08/04/2012    Procedure: BREAST BIOPSY WITH NEEDLE LOCALIZATION;  Surgeon: Gayland Curry, MD,FACS;  Location: [facility];  Service: General;  Laterality: Right;    There were no vitals filed for this visit.  Visit Diagnosis:  Pain, neck  Pain, arm, right  Weakness of right arm  Weakness of right hand  Tightness of neck      Subjective Assessment - 07/12/15 1056    Subjective Pt reports things are improving.  Pt reports the radicular pain in RUE wakes her at night, but has had reduced symptoms during the day.     Currently in Pain? Yes   Pain Score 2    Pain Location --  Rt anterior shoulder/pec   Pain Orientation Right   Pain Descriptors / Indicators Aching   Aggravating Factors  looking down, lifting watering can    Pain Relieving Factors heat             [facility] PT Assessment - 07/12/15 0001    Assessment   Medical Diagnosis Rt cervical radiculopathy   Onset Date/Surgical Date 05/04/15   Hand Dominance Right   Next MD Visit 07/25/15                     [facility] Adult PT Treatment/Exercise - 07/12/15 0001    Neck Exercises: Machines for Strengthening   UBE (Upper Arm Bike)  L1: 1.5 min forward/ 1.5 min backward.    Shoulder Exercises: Supine   Horizontal ABduction Strengthening;Both;10 reps;Theraband   Theraband Level (Shoulder Horizontal ABduction) Level 1 (Yellow)   External Rotation Strengthening;Both;10 reps;Theraband   Theraband Level (Shoulder External Rotation) Level 1 (Yellow)   Flexion Both;10 reps;Strengthening   Theraband Level (Shoulder Flexion) Level 1 (Yellow)   Other Supine Exercises D2 flexion with yellow band x 10 each arm    Shoulder Exercises: Stretch   Other Shoulder Stretches Doorway stretch with arms at low, mid, and high height x 30 sec x 2 each position. No increase in symptoms.    Modalities   Modalities Electrical Stimulation;Cryotherapy   Cryotherapy   Number Minutes Cryotherapy 15 Minutes   Cryotherapy Location Cervical  and Rt thoracic   Type of Cryotherapy Ice pack   Electrical Stimulation   Electrical Stimulation Location Rt upper trap, cervical paraspinal, rhomboid    Electrical Stimulation Action IFC   Electrical Stimulation Parameters to tolerance, 15 min    Electrical Stimulation Goals Pain   Manual Therapy   Manual Therapy Myofascial release;Soft tissue mobilization   Manual therapy comments Tight paraspinals of Rt cervical and thoracic spine; pt reported radicular symptoms to Rt elbow  with pressure to C7 to T1-2 paraspinals.    Soft tissue mobilization to Rt cervical paraspinals with Lt lateral flexion and Lt cervical rotation    Myofascial Release to Rt pec, scalene, platysma, upper trap           PT Short Term Goals - 07/05/15 1133    PT SHORT TERM GOAL #1   Title I with initial HEP ( 07/26/15)    Time 3   Period Weeks   Status New   PT SHORT TERM GOAL #2   Title demo painfree cervical ROM ( 07/26/15)    Time 3   Period Weeks   Status New   PT SHORT TERM GOAL #3   Title improve FOTO =/< 40% limited ( 07/26/15)            PT Long Term Goals - 07/05/15 1134    PT LONG TERM GOAL #1   Title i with  advanced HEP ( 08/16/15)   Time 6   Period Weeks   Status New   PT LONG TERM GOAL #2   Title demo Rt UE strength = to Lt UE ( 08/16/15)    Time 6   Period Weeks   Status New   PT LONG TERM GOAL #3   Title increase grip Rt hand =/> 48#  ( 08/16/15)   Time 6   Period Weeks   Status New   PT LONG TERM GOAL #4   Title report overall decrease of symptoms in Rt UE and neck =/> 75% ( 08/16/15 )   Time 6   Period Weeks   Status New   PT LONG TERM GOAL #5   Title improve FOTO=/< 30% limited ( 08/16/15)   Time 6   Period Weeks   Status New               Plan - 07/12/15 1146    Clinical Impression Statement Pt able to tolerate ther ex with minimal to no increase in symptoms. Pt did require frequent tactile / VC for proper form of exercise.  Pt tolerated manual therapy without increase in radicular symptoms, as she did last visit.  Pt reported elimination of radicular symptoms at end of session after use of estim and ice. Progressing towards goals.    Pt will benefit from skilled therapeutic intervention in order to improve on the following deficits Postural dysfunction;Decreased strength;Pain;Increased muscle spasms;Impaired UE functional use   Rehab Potential Excellent   PT Frequency 2x / week   PT Duration 6 weeks   PT Treatment/Interventions Moist Heat;Therapeutic exercise;Manual techniques;Taping;Electrical Stimulation;Dry needling;Cryotherapy;Patient/family education;Passive range of motion;Traction;Ultrasound   PT Next Visit Plan Continue postural education/strengthening, STW and modalities to neck.  Progress HEP to include tband shoulder and grip strength exercises. Test UE and grip strength.   Consulted and Agree with Plan of Care Patient        Problem List Patient Active Problem List   Diagnosis Date Noted  . Right cervical radiculopathy 06/27/2015   Kerin Perna, PTA 07/12/2015 11:53 AM  [facility] [place], [place], [address] Phone: [phone]   Fax:  [phone]

## 2015-07-14 ENCOUNTER — Encounter: Payer: Self-pay | Admitting: Sports Medicine

## 2015-07-14 ENCOUNTER — Ambulatory Visit (INDEPENDENT_AMBULATORY_CARE_PROVIDER_SITE_OTHER): Payer: BLUE CROSS/BLUE SHIELD | Admitting: Physical Therapy

## 2015-07-14 ENCOUNTER — Encounter: Payer: Self-pay | Admitting: Physical Therapy

## 2015-07-14 ENCOUNTER — Ambulatory Visit (INDEPENDENT_AMBULATORY_CARE_PROVIDER_SITE_OTHER): Payer: BLUE CROSS/BLUE SHIELD | Admitting: Sports Medicine

## 2015-07-14 DIAGNOSIS — M542 Cervicalgia: Secondary | ICD-10-CM

## 2015-07-14 DIAGNOSIS — M79601 Pain in right arm: Secondary | ICD-10-CM | POA: Diagnosis not present

## 2015-07-14 DIAGNOSIS — M539 Dorsopathy, unspecified: Secondary | ICD-10-CM

## 2015-07-14 DIAGNOSIS — M5412 Radiculopathy, cervical region: Secondary | ICD-10-CM | POA: Diagnosis not present

## 2015-07-14 DIAGNOSIS — M6289 Other specified disorders of muscle: Secondary | ICD-10-CM | POA: Diagnosis not present

## 2015-07-14 DIAGNOSIS — R29898 Other symptoms and signs involving the musculoskeletal system: Secondary | ICD-10-CM | POA: Diagnosis not present

## 2015-07-14 MED ORDER — MELOXICAM 15 MG PO TABS: ORAL_TABLET | ORAL | Status: DC

## 2015-07-14 NOTE — Patient Instructions (Signed)
Over Head Pull: Narrow Grip     [place] [phone]   On back, knees bent, feet flat, band across thighs, elbows straight but relaxed. Pull hands apart (start). Keeping elbows straight, bring arms up and over head, hands toward floor. Keep pull steady on band. Hold momentarily. Return slowly, keeping pull steady, back to start. Repeat _2x10__ times. Band color ______   Side Pull: Double Arm   On back, knees bent, feet flat. Arms perpendicular to body, shoulder level, elbows straight but relaxed. Pull arms out to sides, elbows straight. Resistance band comes across collarbones, hands toward floor. Hold momentarily. Slowly return to starting position. Repeat _2x10__ times. Band color _____   Sash   On back, knees bent, feet flat, left hand on left hip, right hand above left. Pull right arm DIAGONALLY (hip to shoulder) across chest. Bring right arm along head toward floor. Hold momentarily. Slowly return to starting position. Repeat _2x10__ times. Do with left arm. Band color ______   Shoulder Rotation: Double Arm   On back, knees bent, feet flat, elbows tucked at sides, bent 90, hands palms up. Pull hands apart and down toward floor, keeping elbows near sides. Hold momentarily. Slowly return to starting position. Repeat 2x10___ times. Band color ______

## 2015-07-14 NOTE — Progress Notes (Signed)
  Subjective:    CC: Follow-up  HPI: This pleasant 54 year old female returns, she had right cervical radiculopathy, we treated her conservatively with prednisone, and formal physical therapy, she returns today with near complete resolution of all of her symptoms, very happy with how things are going. She does need some paperwork filled out for her to return to work.  Past medical history, Surgical history, Family history not pertinant except as noted below, Social history, Allergies, and medications have been entered into the medical record, reviewed, and no changes needed.   Review of Systems: No fevers, chills, night sweats, weight loss, chest pain, or shortness of breath.   Objective:    General: Well Developed, well nourished, and in no acute distress.  Neuro: Alert and oriented x3, extra-ocular muscles intact, sensation grossly intact.  HEENT: Normocephalic, atraumatic, pupils equal round reactive to light, neck supple, no masses, no lymphadenopathy, thyroid nonpalpable.  Skin: Warm and dry, no rashes. Cardiac: Regular rate and rhythm, no murmurs rubs or gallops, no lower extremity edema.  Respiratory: Clear to auscultation bilaterally. Not using accessory muscles, speaking in full sentences. Neck: Negative spurling's Full neck range of motion Grip strength and sensation normal in bilateral hands Strength good C4 to T1 distribution No sensory change to C4 to T1 Reflexes normal  Impression and Recommendations:

## 2015-07-14 NOTE — Assessment & Plan Note (Signed)
Symptoms resolved, continue home rehabilitation. Workup our limitations for 3 weeks and then may return to full duty without restrictions. Adding meloxicam for use daily.

## 2015-07-14 NOTE — Therapy (Signed)
[place], [place], [address] Phone: [phone]   Fax:  [phone]  Physical Therapy Treatment  Patient Details  Name: Theresa Stevenson MRN: [id number] Date of Birth: 06-28-1961 Referring Provider:  Silverio Decamp,*  Encounter Date: 07/14/2015      PT End of Session - 07/14/15 1106    Visit Number 4   Number of Visits 12   Date for PT Re-Evaluation 08/16/15   PT Start Time 1104   PT Stop Time 1150   PT Time Calculation (min) 46 min      Past Medical History  Diagnosis Date  . No pertinent past medical history     Past Surgical History  Procedure Laterality Date  . Abdominal hysterectomy  2008  . Breast biopsy  08/04/2012    Procedure: BREAST BIOPSY WITH NEEDLE LOCALIZATION;  Surgeon: Gayland Curry, MD,FACS;  Location: [facility];  Service: General;  Laterality: Right;    There were no vitals filed for this visit.  Visit Diagnosis:  Pain, neck  Pain, arm, right  Weakness of right arm  Weakness of right hand  Tightness of neck      Subjective Assessment - 07/14/15 1104    Subjective Pt reports she was sore in the Rt upper shoulder/neck this morning, it feels better now, still small soreness. .  Having less pain into her Rt UE.    Currently in Pain? Yes   Pain Score 2    Pain Location Neck   Pain Orientation Right   Pain Descriptors / Indicators Aching   Pain Type Acute pain            [facility] PT Assessment - 07/14/15 0001    Assessment   Medical Diagnosis Rt cervical radiculopathy   Onset Date/Surgical Date 05/04/15   Hand Dominance Right   Next MD Visit 07/25/15   AROM   Overall AROM Comments Bilat UE 's WNL   AROM Assessment Site Cervical   Cervical Flexion WNL   Cervical Extension WNL   Cervical - Right Side Bend WNL   Cervical - Left Side Bend WNL   Cervical - Right Rotation WNL   Cervical - Left Rotation WNL   Strength   Strength  Assessment Site Shoulder   Right Shoulder Flexion 5/5   Right Shoulder ABduction 5/5   Right Shoulder Internal Rotation 5/5   Right Elbow Extension 4+/5   Right Hand Grip (lbs) 50   Left Hand Grip (lbs) 50                     [facility] Adult PT Treatment/Exercise - 07/14/15 0001    Self-Care   Self-Care Lifting  25# knees to waist x 6 with no difficulty   Neck Exercises: Machines for Strengthening   UBE (Upper Arm Bike) L2x4' alt FWD/BWD   Neck Exercises: Supine   Cervical Isometrics 20 reps  head presses   Shoulder Exercises: Supine   Horizontal ABduction Strengthening;Both;20 reps;Theraband   Theraband Level (Shoulder Horizontal ABduction) Level 1 (Yellow)   External Rotation Strengthening;Both;20 reps;Theraband   Theraband Level (Shoulder External Rotation) Level 1 (Yellow)   Flexion AAROM;Both;20 reps;Theraband   Theraband Level (Shoulder Flexion) Level 1 (Yellow)   Other Supine Exercises D2 flexion with yellow band x 10 each arm    Modalities   Modalities Ultrasound   Ultrasound   Ultrasound Location Rt upper trap   Ultrasound Parameters combo 100%, 3.37mhz, 1.5 w/cm2  Ultrasound Goals Pain  tightness                PT Education - 07/14/15 1107    Education provided Yes   Education Details HEP    Person(s) Educated Patient   Methods Explanation;Demonstration;Handout   Comprehension Returned demonstration          PT Short Term Goals - 07/05/15 1133    PT SHORT TERM GOAL #1   Title I with initial HEP ( 07/26/15)    Time 3   Period Weeks   Status New   PT SHORT TERM GOAL #2   Title demo painfree cervical ROM ( 07/26/15)    Time 3   Period Weeks   Status New   PT SHORT TERM GOAL #3   Title improve FOTO =/< 40% limited ( 07/26/15)            PT Long Term Goals - 07/05/15 1134    PT LONG TERM GOAL #1   Title i with advanced HEP ( 08/16/15)   Time 6   Period Weeks   Status New   PT LONG TERM GOAL #2   Title demo Rt UE strength = to  Lt UE ( 08/16/15)    Time 6   Period Weeks   Status New   PT LONG TERM GOAL #3   Title increase grip Rt hand =/> 48#  ( 08/16/15)   Time 6   Period Weeks   Status New   PT LONG TERM GOAL #4   Title report overall decrease of symptoms in Rt UE and neck =/> 75% ( 08/16/15 )   Time 6   Period Weeks   Status New   PT LONG TERM GOAL #5   Title improve FOTO=/< 30% limited ( 08/16/15)   Time 6   Period Weeks   Status New               Plan - 07/14/15 1112    Clinical Impression Statement Pt tolerated ther ex from last visit, this has been issued for home now.  She continues with slow improvement in her symptoms. She wishes to return to work. She may have a flare up if she returns to full shifts. I would recommen 4-6 hrs to start and see howshe does    Pt will benefit from skilled therapeutic intervention in order to improve on the following deficits Postural dysfunction;Decreased strength;Pain;Increased muscle spasms;Impaired UE functional use   Rehab Potential Excellent   PT Frequency 2x / week   PT Duration 6 weeks   PT Treatment/Interventions Moist Heat;Therapeutic exercise;Manual techniques;Taping;Electrical Stimulation;Dry needling;Cryotherapy;Patient/family education;Passive range of motion;Traction;Ultrasound   PT Next Visit Plan Continue postural education/strengthening, STW and modalities to neck.  Progress HEP to include tband shoulder and grip strength exercises.    Consulted and Agree with Plan of Care Patient        Problem List Patient Active Problem List   Diagnosis Date Noted  . Right cervical radiculopathy 06/27/2015    Manuela Schwartz shaver PT 07/14/2015, 12:01 PM  [facility] [place], [place], [address] Phone: [phone]   Fax:  [phone]

## 2015-07-17 ENCOUNTER — Encounter: Payer: BLUE CROSS/BLUE SHIELD | Admitting: Physical Therapy

## 2015-07-19 ENCOUNTER — Ambulatory Visit (INDEPENDENT_AMBULATORY_CARE_PROVIDER_SITE_OTHER): Payer: BLUE CROSS/BLUE SHIELD | Admitting: Physical Therapy

## 2015-07-19 DIAGNOSIS — M542 Cervicalgia: Secondary | ICD-10-CM | POA: Diagnosis not present

## 2015-07-19 DIAGNOSIS — M539 Dorsopathy, unspecified: Secondary | ICD-10-CM | POA: Diagnosis not present

## 2015-07-19 DIAGNOSIS — R29898 Other symptoms and signs involving the musculoskeletal system: Secondary | ICD-10-CM | POA: Diagnosis not present

## 2015-07-19 NOTE — Patient Instructions (Signed)
Elbow Extension: Resisted   With tubing wrapped around left fist and other end anchored, straighten elbow. Repeat _10__ times per set. Do __1-2__ sets per session. Do __1__ sessions per day.  Low Row: Standing   Face anchor, feet shoulder width apart. Palms up, pull arms back, squeezing shoulder blades together. Repeat _10_ times per set. Do _1_ sets per session. Do _3_ sessions per week. Anchor Height: Waist  [facility] [place], [place] [address]  [phone] (office) [phone] (fax)

## 2015-07-19 NOTE — Therapy (Addendum)
[place], [place], [address] Phone: [phone]   Fax:  [phone]  Physical Therapy Treatment  Patient Details  Name: Theresa Stevenson MRN: [id number] Date of Birth: [birth year] Referring Provider:  Silverio Decamp,*  Encounter Date: 07/19/2015      PT End of Session - 07/19/15 1437    Visit Number 5   Number of Visits 12   Date for PT Re-Evaluation 08/16/15   PT Start Time 8828   PT Stop Time 1522   PT Time Calculation (min) 49 min   Activity Tolerance Patient tolerated treatment well      Past Medical History  Diagnosis Date  . No pertinent past medical history     Past Surgical History  Procedure Laterality Date  . Abdominal hysterectomy  2008  . Breast biopsy  08/04/2012    Procedure: BREAST BIOPSY WITH NEEDLE LOCALIZATION;  Surgeon: Gayland Curry, MD,FACS;  Location: [facility];  Service: General;  Laterality: Right;    There were no vitals filed for this visit.  Visit Diagnosis:  Tightness of neck  Weakness of right arm  Pain, neck          [facility] PT Assessment - 07/19/15 0001    Assessment   Medical Diagnosis Rt cervical radiculopathy   Onset Date/Surgical Date 05/04/15   Hand Dominance Right   Next MD Visit 07/25/15   Observation/Other Assessments   Focus on Therapeutic Outcomes (FOTO)  24% limitation                      [facility] Adult PT Treatment/Exercise - 07/19/15 0001    Neck Exercises: Machines for Strengthening   UBE (Upper Arm Bike) L2x4' alt FWD/BWD   Neck Exercises: Standing   Wall Push Ups 10 reps  poor form despite cues. Stopped   Neck Exercises: Supine   Cervical Isometrics 10 reps  head presses; 10 shoulder presses with 3 sec hold x 10   Shoulder Exercises: Supine   Horizontal ABduction Strengthening;Both;10 reps;Theraband   Theraband Level (Shoulder Horizontal ABduction) Level 2 (Red)   External Rotation  Strengthening;Both;10 reps;Theraband   Theraband Level (Shoulder External Rotation) Level 2 (Red)   Flexion Strengthening;Both;10 reps;Theraband   Theraband Level (Shoulder Flexion) Level 2 (Red)   Other Supine Exercises D2 flexion with red band x 10 each arm    Shoulder Exercises: Standing   Row Right;Left;10 reps;Theraband   Theraband Level (Shoulder Row) Level 2 (Red)   Other Standing Exercises Elbow extension with red band, Rt/Lt x 10 reps    Modalities   Modalities Ultrasound   Ultrasound   Ultrasound Location Rt upper trap    Ultrasound Parameters combo: 100%, 1.3 w/cm2, 3.3 w/cm2, 8 min    Ultrasound Goals Pain  tightness                  PT Short Term Goals - 07/19/15 1531    PT SHORT TERM GOAL #1   Title I with initial HEP ( 07/26/15)    Time 3   Period Weeks   Status Achieved   PT SHORT TERM GOAL #2   Title demo painfree cervical ROM ( 07/26/15)    Time 3   Period Weeks   Status Achieved   PT SHORT TERM GOAL #3   Title improve FOTO =/< 40% limited ( 07/26/15)    Status Achieved           PT  Long Term Goals - 07/19/15 1535    PT LONG TERM GOAL #1   Title i with advanced HEP ( 08/16/15)   Time 6   Period Weeks   Status Achieved   PT LONG TERM GOAL #2   Title demo Rt UE strength = to Lt UE ( 08/16/15)    Time 6   Period Weeks   Status On-going   PT LONG TERM GOAL #3   Title increase grip Rt hand =/> 48#  ( 08/16/15)   Time 6   Period Weeks   Status Achieved   PT LONG TERM GOAL #4   Title report overall decrease of symptoms in Rt UE and neck =/> 75% ( 08/16/15 )   Time 6   Period Weeks   Status Achieved   PT LONG TERM GOAL #5   Title improve FOTO=/< 30% limited ( 08/16/15)   Time 6   Period Weeks   Status Achieved               Plan - 07/19/15 1449    Clinical Impression Statement Pt tolerated increased resistance with ther ex without increase in pain.  Pt tolerated 4 hrs of work for 2 shifts this wk with some reported mild  radicular symptoms; resolved with rest and stretches. Pt has partially met her goals. Pt is satisfied with her current level of function and wishes to place therapy on hold for 2 wks, in case she has flare while increasing work load.    Pt will benefit from skilled therapeutic intervention in order to improve on the following deficits Postural dysfunction;Decreased strength;Pain;Increased muscle spasms;Impaired UE functional use   Rehab Potential Excellent   PT Frequency 2x / week   PT Duration 6 weeks   PT Treatment/Interventions Moist Heat;Therapeutic exercise;Manual techniques;Taping;Electrical Stimulation;Dry needling;Cryotherapy;Patient/family education;Passive range of motion;Traction;Ultrasound   PT Next Visit Plan Hold PT for 2 wks.  If pt returns, will continue progressive postural strengthening.    Consulted and Agree with Plan of Care Patient        Problem List Patient Active Problem List   Diagnosis Date Noted  . Right cervical radiculopathy 06/27/2015    Kerin Perna, PTA 07/19/2015 3:37 PM  [facility] [place], [place], [address] Phone: [phone]   Fax:  [phone]     PHYSICAL THERAPY DISCHARGE SUMMARY  Visits from Start of Care: 5  Current functional level related to goals / functional outcomes: See above   Remaining deficits: Some weakness inUE   Education / Equipment: HEP Plan: Patient agrees to discharge.  Patient goals were partially met. Patient is being discharged due to being pleased with the current functional level.  ?????       Jeral Pinch, PT 08/18/2015 8:17 AM

## 2015-07-26 ENCOUNTER — Encounter: Payer: BLUE CROSS/BLUE SHIELD | Admitting: Physical Therapy

## 2015-07-27 ENCOUNTER — Ambulatory Visit: Payer: BLUE CROSS/BLUE SHIELD | Admitting: Sports Medicine

## 2016-10-28 IMAGING — CR DG CERVICAL SPINE COMPLETE 4+V
5 series · 5 of 5 positions shown · non-contrast
Comparison: None.

CLINICAL DATA: Right side neck pain radiating down right arm.

EXAM:
CERVICAL SPINE  4+ VIEWS

[c-spine lat]
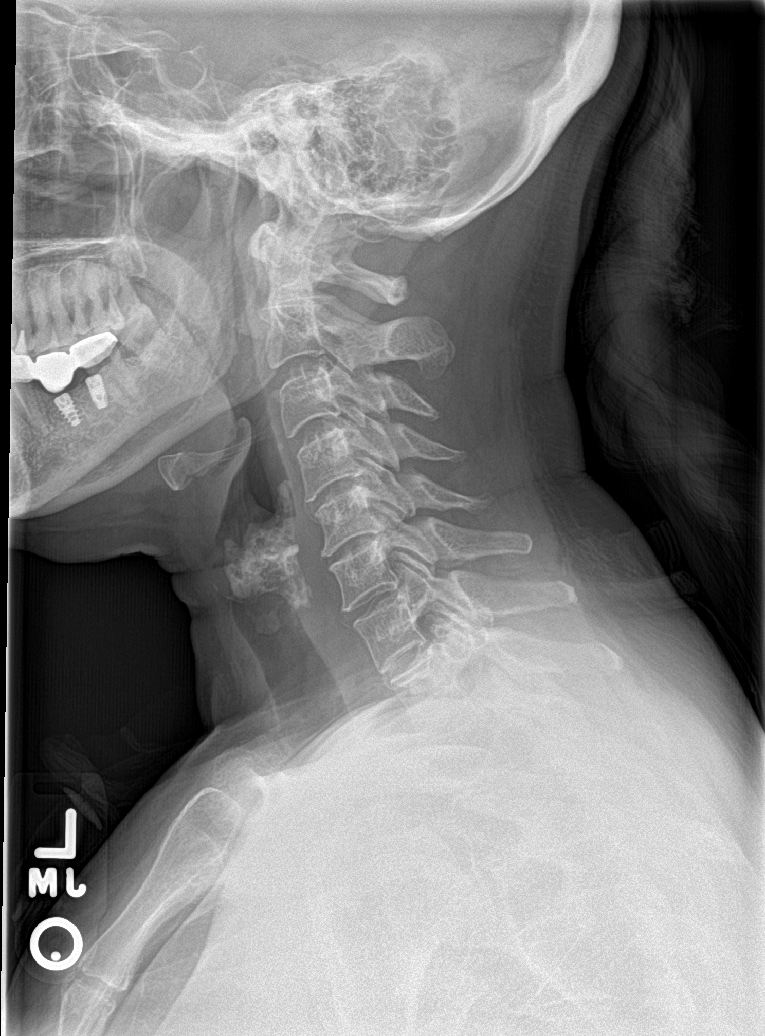

[c-spine obl (1 of 2)]
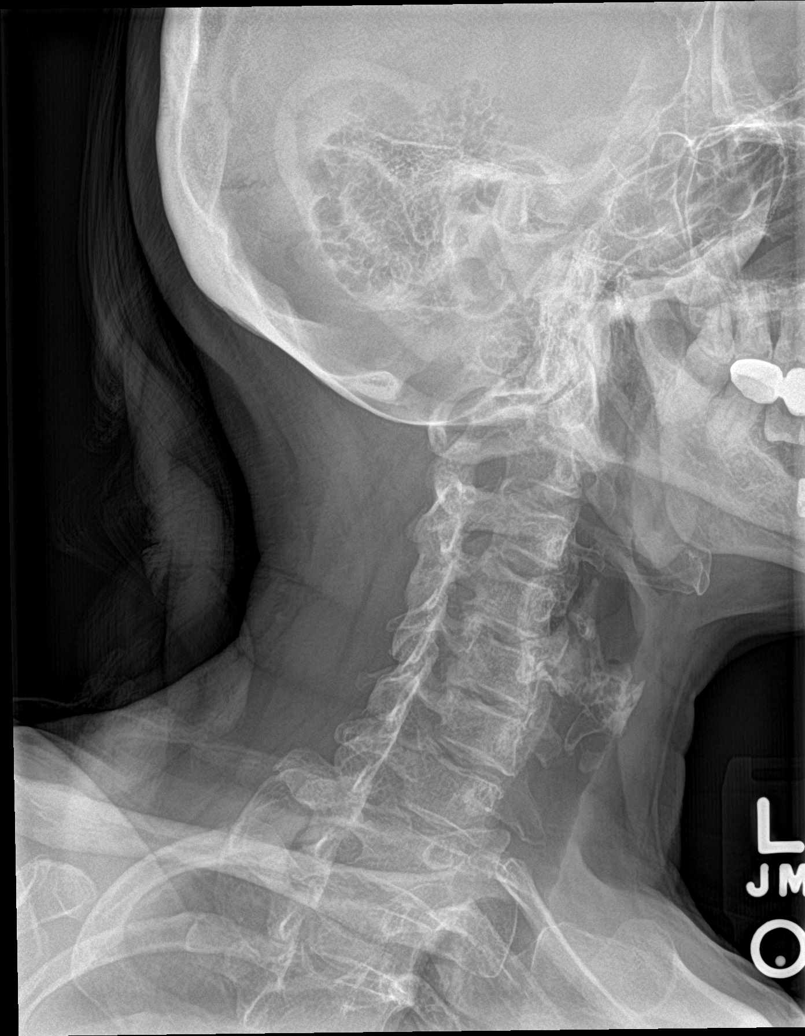

[c-spine obl (2 of 2)]
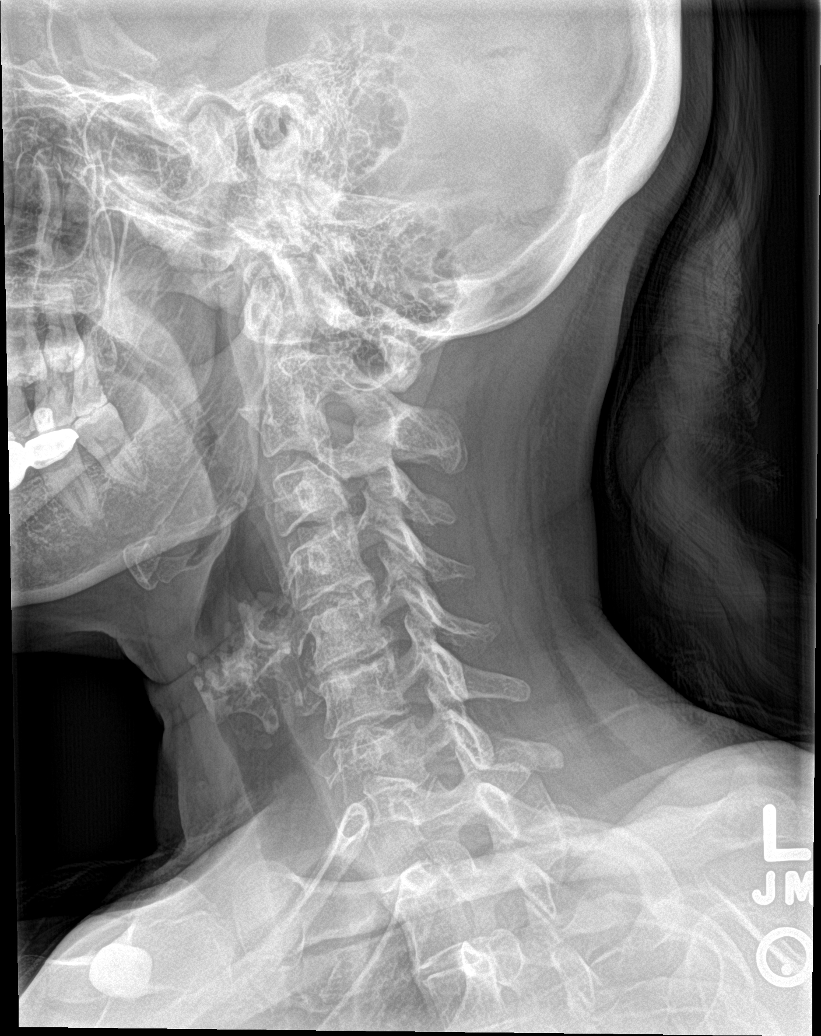

[c-spine ap]
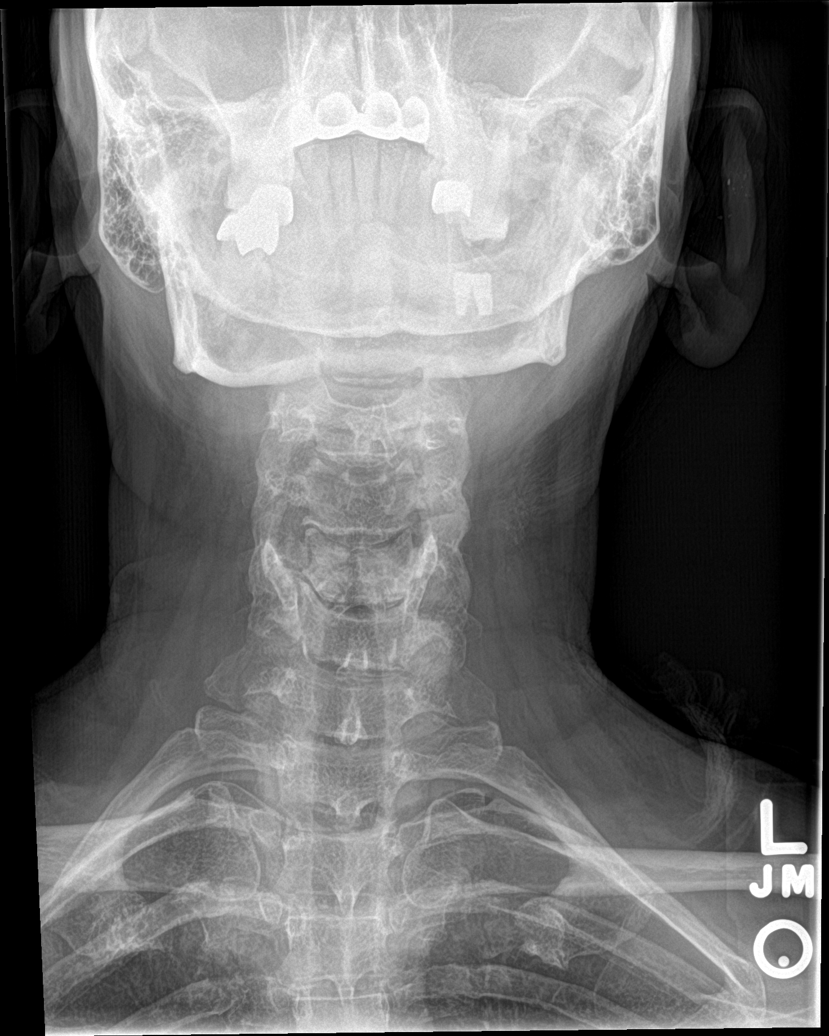

[c-spine open mouth]
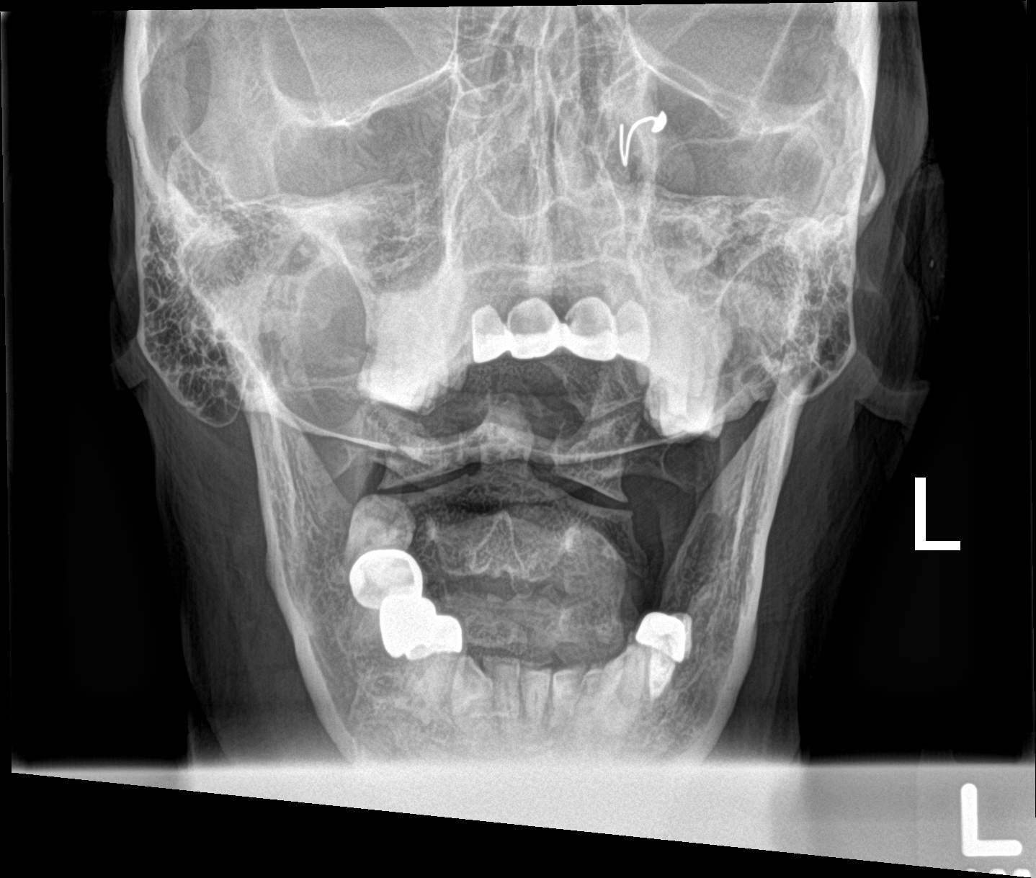

[5 of 5 positions shown; findings below may reference images not displayed]

FINDINGS: Mild uncovertebral spurring and facet disease causes mild right
neural foraminal narrowing at C4-5 and C5-6 and mild left neural
foraminal narrowing at C5-6. Normal alignment. Disc spaces are
maintained. Prevertebral soft tissues are normal. No fracture.
IMPRESSION: Mild spondylosis and neural foraminal narrowing as described above.
No acute findings.

## 2017-04-04 ENCOUNTER — Ambulatory Visit (INDEPENDENT_AMBULATORY_CARE_PROVIDER_SITE_OTHER): Payer: BLUE CROSS/BLUE SHIELD

## 2017-04-04 ENCOUNTER — Ambulatory Visit (INDEPENDENT_AMBULATORY_CARE_PROVIDER_SITE_OTHER): Payer: BLUE CROSS/BLUE SHIELD | Admitting: Sports Medicine

## 2017-04-04 ENCOUNTER — Other Ambulatory Visit: Payer: Self-pay | Admitting: Sports Medicine

## 2017-04-04 ENCOUNTER — Encounter: Payer: Self-pay | Admitting: Sports Medicine

## 2017-04-04 DIAGNOSIS — Z1239 Encounter for other screening for malignant neoplasm of breast: Secondary | ICD-10-CM

## 2017-04-04 DIAGNOSIS — S92512A Displaced fracture of proximal phalanx of left lesser toe(s), initial encounter for closed fracture: Secondary | ICD-10-CM | POA: Diagnosis not present

## 2017-04-04 DIAGNOSIS — S99922A Unspecified injury of left foot, initial encounter: Secondary | ICD-10-CM | POA: Diagnosis not present

## 2017-04-04 DIAGNOSIS — Z1231 Encounter for screening mammogram for malignant neoplasm of breast: Secondary | ICD-10-CM

## 2017-04-04 DIAGNOSIS — W010XXA Fall on same level from slipping, tripping and stumbling without subsequent striking against object, initial encounter: Secondary | ICD-10-CM

## 2017-04-04 NOTE — Progress Notes (Signed)
   Subjective:    I'm seeing this patient as a consultation for:  Dr. Leighton Ruff  CC: Left foot injury  HPI: This is a very pleasant 56 year old female, one week ago she accidentally kicked a wall, she had immediate pain, swelling, bruising at the fourth MTP, this has evolved into more swelling, and she has exquisite pain with bearing weight. Pain is localized without radiation. Declines narcotic analgesics.  Past medical history:  Negative.  See flowsheet/record as well for more information.  Surgical history: Negative.  See flowsheet/record as well for more information.  Family history: Negative.  See flowsheet/record as well for more information.  Social history: Negative.  See flowsheet/record as well for more information.  Allergies, and medications have been entered into the medical record, reviewed, and no changes needed.   Review of Systems: No headache, visual changes, nausea, vomiting, diarrhea, constipation, dizziness, abdominal pain, skin rash, fevers, chills, night sweats, weight loss, swollen lymph nodes, body aches, joint swelling, muscle aches, chest pain, shortness of breath, mood changes, visual or auditory hallucinations.   Objective:   General: Well Developed, well nourished, and in no acute distress.  Neuro/Psych: Alert and oriented x3, extra-ocular muscles intact, able to move all 4 extremities, sensation grossly intact. Skin: Warm and dry, no rashes noted.  Respiratory: Not using accessory muscles, speaking in full sentences, trachea midline.  Cardiovascular: Pulses palpable, no extremity edema. Abdomen: Does not appear distended. Left Foot: Visibly swollen and bruised at the fourth MTP with exquisite tenderness. Range of motion is full in all directions. Strength is 5/5 in all directions. No hallux valgus. No pes cavus or pes planus. No abnormal callus noted. No pain over the navicular prominence, or base of fifth metatarsal. No tenderness to palpation  of the calcaneal insertion of plantar fascia. No pain at the Achilles insertion. No pain over the calcaneal bursa. No pain of the retrocalcaneal bursa. No pain with compression of the metatarsal heads. Neurovascularly intact distally.  Postop shoe placed  Impression and Recommendations:   This case required medical decision making of moderate complexity.  Foot injury, left, initial encounter Accidentally kicked the wall one week ago. Exquisite pain over the fourth MTP. Suspect fracture, postop shoe, patient declines narcotics, she can use Tylenol over-the-counter.  Xrays. Return in 2 weeks.

## 2017-04-04 NOTE — Assessment & Plan Note (Signed)
Accidentally kicked the wall one week ago. Exquisite pain over the fourth MTP. Suspect fracture, postop shoe, patient declines narcotics, she can use Tylenol over-the-counter.  Xrays. Return in 2 weeks.

## 2017-04-17 ENCOUNTER — Ambulatory Visit: Payer: BLUE CROSS/BLUE SHIELD | Admitting: Sports Medicine

## 2018-05-29 ENCOUNTER — Other Ambulatory Visit: Payer: Self-pay | Admitting: Sports Medicine

## 2018-05-29 DIAGNOSIS — Z1239 Encounter for other screening for malignant neoplasm of breast: Secondary | ICD-10-CM

## 2018-06-04 ENCOUNTER — Ambulatory Visit (INDEPENDENT_AMBULATORY_CARE_PROVIDER_SITE_OTHER): Payer: BLUE CROSS/BLUE SHIELD

## 2018-06-04 DIAGNOSIS — Z1231 Encounter for screening mammogram for malignant neoplasm of breast: Secondary | ICD-10-CM

## 2018-06-04 DIAGNOSIS — Z1239 Encounter for other screening for malignant neoplasm of breast: Secondary | ICD-10-CM

## 2020-01-13 ENCOUNTER — Ambulatory Visit: Payer: BC Managed Care – PPO | Attending: Internal Medicine

## 2020-01-13 DIAGNOSIS — Z23 Encounter for immunization: Secondary | ICD-10-CM

## 2020-01-13 NOTE — Progress Notes (Signed)
   Covid-19 Vaccination Clinic  Name:  Theresa Stevenson    MRN: [id number] DOB: [birth year]  01/13/2020  Theresa Stevenson was observed post Covid-19 immunization for 15 minutes without incident. She was provided with Vaccine Information Sheet and instruction to access the V-Safe system.   Theresa Stevenson was instructed to call 911 with any severe reactions post vaccine: Marland Kitchen Difficulty breathing  . Swelling of face and throat  . A fast heartbeat  . A bad rash all over body  . Dizziness and weakness   Immunizations Administered    Name Date Dose VIS Date Route   Pfizer COVID-19 Vaccine 01/13/2020  9:36 AM 0.3 mL 10/08/2019 Intramuscular   Manufacturer: [place]   Lot: [id number]   [place]: [id number]

## 2020-02-09 ENCOUNTER — Ambulatory Visit: Payer: BC Managed Care – PPO | Attending: Internal Medicine

## 2020-02-09 DIAGNOSIS — Z23 Encounter for immunization: Secondary | ICD-10-CM

## 2020-02-09 NOTE — Progress Notes (Signed)
   Covid-19 Vaccination Clinic  Name:  Theresa Stevenson    MRN: [id number] DOB: 1961/08/21  02/09/2020  [name] was observed post Covid-19 immunization for 15 minutes without incident. She was provided with Vaccine Information Sheet and instruction to access the V-Safe system.   [name] was instructed to call 911 with any severe reactions post vaccine: Marland Kitchen Difficulty breathing  . Swelling of face and throat  . A fast heartbeat  . A bad rash all over body  . Dizziness and weakness   Immunizations Administered    Name Date Dose VIS Date Route   Pfizer COVID-19 Vaccine 02/09/2020  8:15 AM 0.3 mL 10/08/2019 Intramuscular   Manufacturer: [place]   Lot: [id number]   [place]: [id number]

## 2020-04-05 ENCOUNTER — Other Ambulatory Visit: Payer: Self-pay | Admitting: Internal Medicine

## 2020-04-05 DIAGNOSIS — Z1231 Encounter for screening mammogram for malignant neoplasm of breast: Secondary | ICD-10-CM

## 2020-04-06 ENCOUNTER — Ambulatory Visit (INDEPENDENT_AMBULATORY_CARE_PROVIDER_SITE_OTHER): Payer: BC Managed Care – PPO

## 2020-04-06 ENCOUNTER — Other Ambulatory Visit: Payer: Self-pay

## 2020-04-06 ENCOUNTER — Other Ambulatory Visit: Payer: Self-pay | Admitting: Internal Medicine

## 2020-04-06 DIAGNOSIS — Z1231 Encounter for screening mammogram for malignant neoplasm of breast: Secondary | ICD-10-CM

## 2021-11-07 ENCOUNTER — Other Ambulatory Visit: Payer: Self-pay | Admitting: Internal Medicine

## 2021-11-07 DIAGNOSIS — Z1231 Encounter for screening mammogram for malignant neoplasm of breast: Secondary | ICD-10-CM

## 2021-11-08 ENCOUNTER — Other Ambulatory Visit: Payer: Self-pay

## 2021-11-08 ENCOUNTER — Ambulatory Visit (INDEPENDENT_AMBULATORY_CARE_PROVIDER_SITE_OTHER): Payer: BC Managed Care – PPO

## 2021-11-08 DIAGNOSIS — Z1231 Encounter for screening mammogram for malignant neoplasm of breast: Secondary | ICD-10-CM

## 2023-03-26 ENCOUNTER — Other Ambulatory Visit: Payer: Self-pay | Admitting: Internal Medicine

## 2023-03-26 DIAGNOSIS — Z1231 Encounter for screening mammogram for malignant neoplasm of breast: Secondary | ICD-10-CM

## 2023-04-02 ENCOUNTER — Ambulatory Visit (INDEPENDENT_AMBULATORY_CARE_PROVIDER_SITE_OTHER): Payer: BC Managed Care – PPO

## 2023-04-02 DIAGNOSIS — Z1231 Encounter for screening mammogram for malignant neoplasm of breast: Secondary | ICD-10-CM | POA: Diagnosis not present

## 2024-05-11 ENCOUNTER — Other Ambulatory Visit: Payer: Self-pay | Admitting: Internal Medicine

## 2024-05-11 DIAGNOSIS — Z1231 Encounter for screening mammogram for malignant neoplasm of breast: Secondary | ICD-10-CM

## 2024-06-10 ENCOUNTER — Ambulatory Visit

## 2024-07-22 ENCOUNTER — Ambulatory Visit

## 2024-07-22 ENCOUNTER — Other Ambulatory Visit: Payer: Self-pay | Admitting: Family Medicine

## 2024-07-22 DIAGNOSIS — Z1231 Encounter for screening mammogram for malignant neoplasm of breast: Secondary | ICD-10-CM

## 2024-07-27 ENCOUNTER — Other Ambulatory Visit: Payer: Self-pay | Admitting: Family Medicine

## 2024-07-27 DIAGNOSIS — R928 Other abnormal and inconclusive findings on diagnostic imaging of breast: Secondary | ICD-10-CM

## 2024-08-02 ENCOUNTER — Ambulatory Visit
Admission: RE | Admit: 2024-08-02 | Discharge: 2024-08-02 | Disposition: A | Discharge status: Home / Self Care | Source: Ambulatory Visit | Attending: Family Medicine | Admitting: Family Medicine

## 2024-08-02 ENCOUNTER — Ambulatory Visit
Admission: RE | Admit: 2024-08-02 | Discharge: 2024-08-02 | Disposition: A | Source: Ambulatory Visit | Attending: Family Medicine | Admitting: Family Medicine

## 2024-08-02 DIAGNOSIS — R928 Other abnormal and inconclusive findings on diagnostic imaging of breast: Secondary | ICD-10-CM
